# Patient Record
Sex: Female | Born: 1965 | Race: White | Hispanic: No | Marital: Married | Smoking: Never smoker
Health system: Southern US, Community
[De-identification: ages and names within clinical notes are randomized; demographics above are authoritative.]

## PROBLEM LIST (undated history)

## (undated) DIAGNOSIS — J45909 Unspecified asthma, uncomplicated: Secondary | ICD-10-CM

## (undated) DIAGNOSIS — F32A Depression, unspecified: Secondary | ICD-10-CM

## (undated) DIAGNOSIS — F329 Major depressive disorder, single episode, unspecified: Secondary | ICD-10-CM

## (undated) DIAGNOSIS — G43909 Migraine, unspecified, not intractable, without status migrainosus: Secondary | ICD-10-CM

## (undated) HISTORY — PX: CHOLECYSTECTOMY: SHX55

---

## 1997-12-23 ENCOUNTER — Other Ambulatory Visit: Admission: RE | Admit: 1997-12-23 | Discharge: 1997-12-23 | Payer: Self-pay | Admitting: Obstetrics and Gynecology

## 1999-04-25 ENCOUNTER — Other Ambulatory Visit: Admission: RE | Admit: 1999-04-25 | Discharge: 1999-04-25 | Payer: Self-pay | Admitting: Obstetrics and Gynecology

## 2000-05-14 ENCOUNTER — Other Ambulatory Visit: Admission: RE | Admit: 2000-05-14 | Discharge: 2000-05-14 | Payer: Self-pay | Admitting: Obstetrics and Gynecology

## 2001-08-13 ENCOUNTER — Other Ambulatory Visit: Admission: RE | Admit: 2001-08-13 | Discharge: 2001-08-13 | Payer: Self-pay | Admitting: Obstetrics and Gynecology

## 2003-01-05 ENCOUNTER — Other Ambulatory Visit: Admission: RE | Admit: 2003-01-05 | Discharge: 2003-01-05 | Payer: Self-pay | Admitting: Obstetrics and Gynecology

## 2004-03-27 ENCOUNTER — Other Ambulatory Visit: Admission: RE | Admit: 2004-03-27 | Discharge: 2004-03-27 | Payer: Self-pay | Admitting: Obstetrics and Gynecology

## 2005-06-04 ENCOUNTER — Other Ambulatory Visit: Admission: RE | Admit: 2005-06-04 | Discharge: 2005-06-04 | Payer: Self-pay | Admitting: Obstetrics and Gynecology

## 2008-03-17 ENCOUNTER — Ambulatory Visit: Payer: Self-pay | Admitting: Hematology

## 2015-11-20 ENCOUNTER — Emergency Department
Admission: EM | Admit: 2015-11-20 | Discharge: 2015-11-20 | Disposition: A | Discharge status: Home / Self Care | Payer: BC Managed Care – PPO | Attending: Emergency Medicine | Admitting: Emergency Medicine

## 2015-11-20 ENCOUNTER — Emergency Department: Payer: BC Managed Care – PPO

## 2015-11-20 ENCOUNTER — Encounter: Payer: Self-pay | Admitting: Emergency Medicine

## 2015-11-20 DIAGNOSIS — R079 Chest pain, unspecified: Secondary | ICD-10-CM | POA: Diagnosis not present

## 2015-11-20 DIAGNOSIS — J45909 Unspecified asthma, uncomplicated: Secondary | ICD-10-CM | POA: Diagnosis not present

## 2015-11-20 DIAGNOSIS — F329 Major depressive disorder, single episode, unspecified: Secondary | ICD-10-CM | POA: Insufficient documentation

## 2015-11-20 DIAGNOSIS — R55 Syncope and collapse: Secondary | ICD-10-CM | POA: Diagnosis present

## 2015-11-20 HISTORY — DX: Major depressive disorder, single episode, unspecified: F32.9

## 2015-11-20 HISTORY — DX: Depression, unspecified: F32.A

## 2015-11-20 HISTORY — DX: Migraine, unspecified, not intractable, without status migrainosus: G43.909

## 2015-11-20 HISTORY — DX: Unspecified asthma, uncomplicated: J45.909

## 2015-11-20 LAB — URINALYSIS COMPLETE WITH MICROSCOPIC (ARMC ONLY)
BILIRUBIN URINE: NEGATIVE
GLUCOSE, UA: NEGATIVE mg/dL
HGB URINE DIPSTICK: NEGATIVE
Ketones, ur: NEGATIVE mg/dL
Nitrite: NEGATIVE
Protein, ur: 30 mg/dL — AB
SPECIFIC GRAVITY, URINE: 1.016 (ref 1.005–1.030)
pH: 5 (ref 5.0–8.0)

## 2015-11-20 LAB — BASIC METABOLIC PANEL
ANION GAP: 9 (ref 5–15)
BUN: 13 mg/dL (ref 6–20)
CALCIUM: 8.7 mg/dL — AB (ref 8.9–10.3)
CO2: 26 mmol/L (ref 22–32)
CREATININE: 1.13 mg/dL — AB (ref 0.44–1.00)
Chloride: 101 mmol/L (ref 101–111)
GFR calc non Af Amer: 56 mL/min — ABNORMAL LOW (ref >60)
Glucose, Bld: 92 mg/dL (ref 65–99)
Potassium: 3.1 mmol/L — ABNORMAL LOW (ref 3.5–5.1)
SODIUM: 136 mmol/L (ref 135–145)

## 2015-11-20 LAB — CBC
HCT: 40.5 % (ref 35.0–47.0)
Hemoglobin: 13.5 g/dL (ref 12.0–16.0)
MCH: 32.2 pg (ref 26.0–34.0)
MCHC: 33.3 g/dL (ref 32.0–36.0)
MCV: 96.8 fL (ref 80.0–100.0)
PLATELETS: 217 10*3/uL (ref 150–440)
RBC: 4.18 MIL/uL (ref 3.80–5.20)
RDW: 14.1 % (ref 11.5–14.5)
WBC: 12.3 10*3/uL — AB (ref 3.6–11.0)

## 2015-11-20 LAB — GLUCOSE, CAPILLARY: GLUCOSE-CAPILLARY: 130 mg/dL — AB (ref 65–99)

## 2015-11-20 LAB — FIBRIN DERIVATIVES D-DIMER (ARMC ONLY): Fibrin derivatives D-dimer (ARMC): 583 — ABNORMAL HIGH (ref 0–499)

## 2015-11-20 LAB — TSH: TSH: 1.719 u[IU]/mL (ref 0.350–4.500)

## 2015-11-20 LAB — TROPONIN I

## 2015-11-20 LAB — POC URINE PREG, ED: PREG TEST UR: NEGATIVE

## 2015-11-20 IMAGING — CT CT ANGIO CHEST
2 of 6 series · 18 of 46 positions shown · IV contrast (APPLIED)
Comparison: Chest radiograph dated [DATE]

CLINICAL DATA: 49-year-old female with chest pain and syncope

EXAM:
CT ANGIOGRAPHY CHEST WITH CONTRAST
TECHNIQUE: Multidetector CT imaging of the chest was performed using the
standard protocol during bolus administration of intravenous
contrast. Multiplanar CT image reconstructions and MIPs were
obtained to evaluate the vascular anatomy.
CONTRAST:  75 cc Isovue 370

[Series 5: pe thins 1.5 · axial · 0.68mm/px · z∈[-406,-154]mm · 15 of 236 slices shown]
[im 13/236  lung]
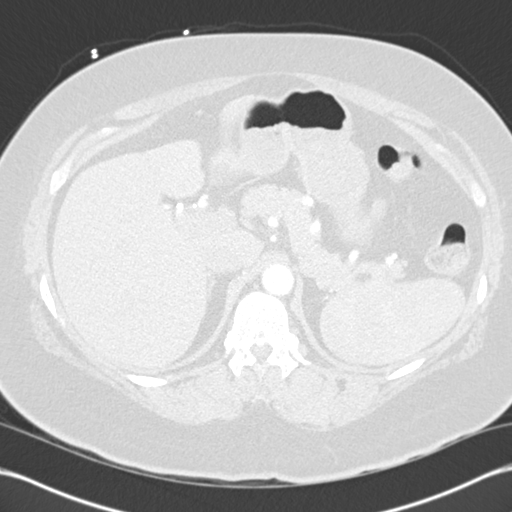
[im 25/236  soft-tissue]
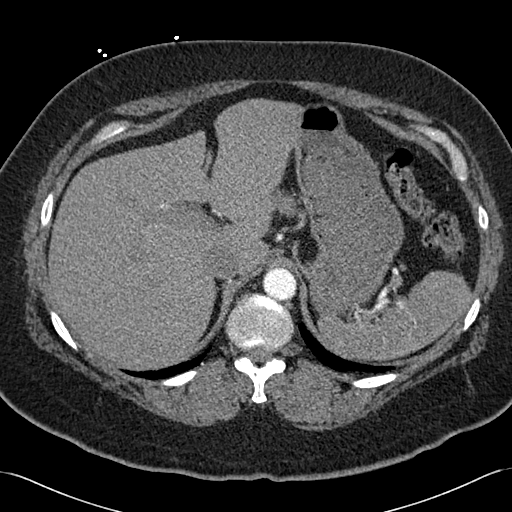
[im 50/236  lung]
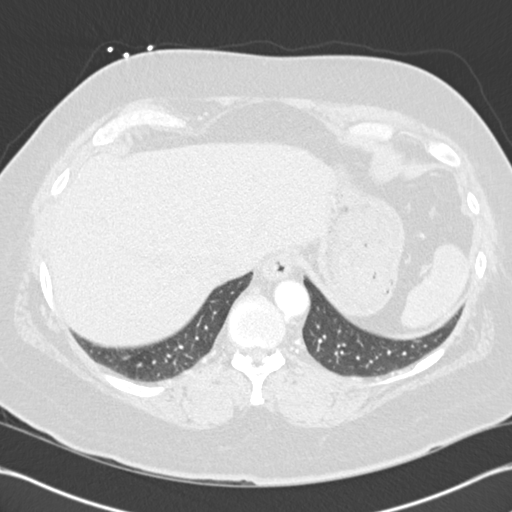
[im 62/236  soft-tissue]
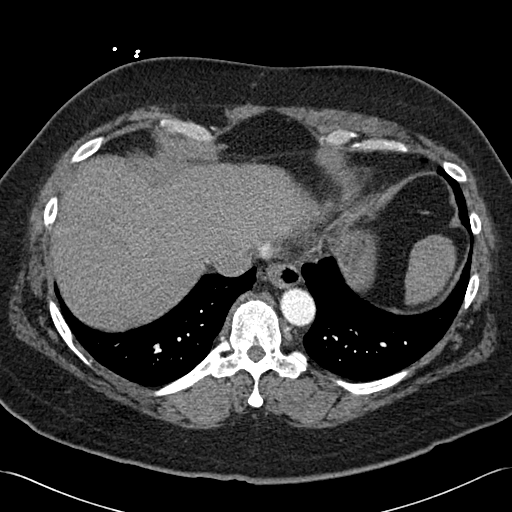
[im 75/236  lung]
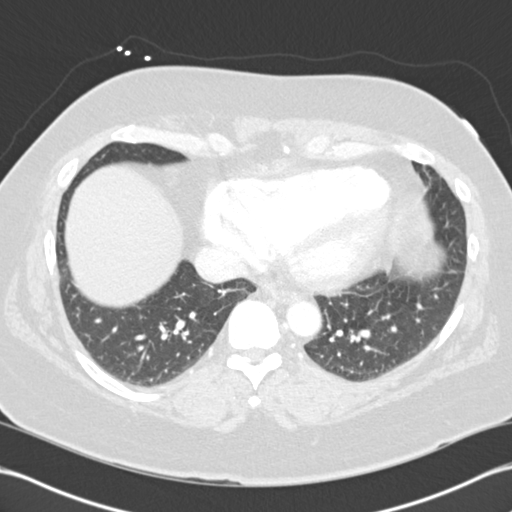
[im 87/236  soft-tissue]
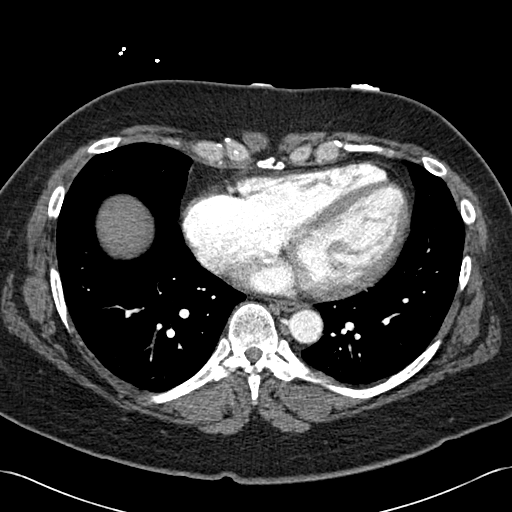
[im 99/236  lung]
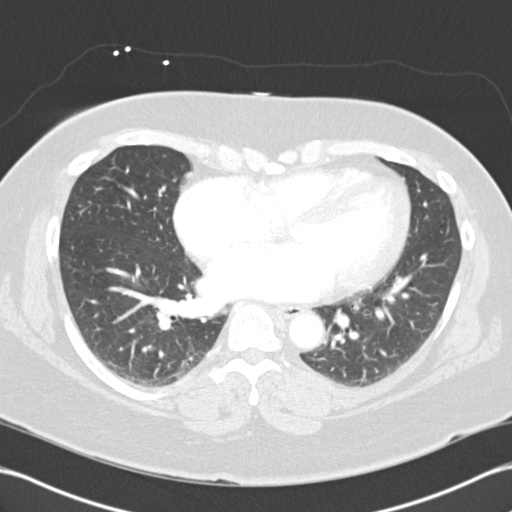
[im 124/236  soft-tissue]
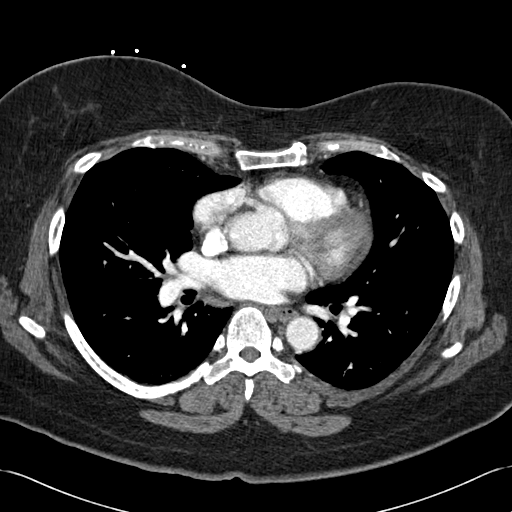
[im 137/236  lung]
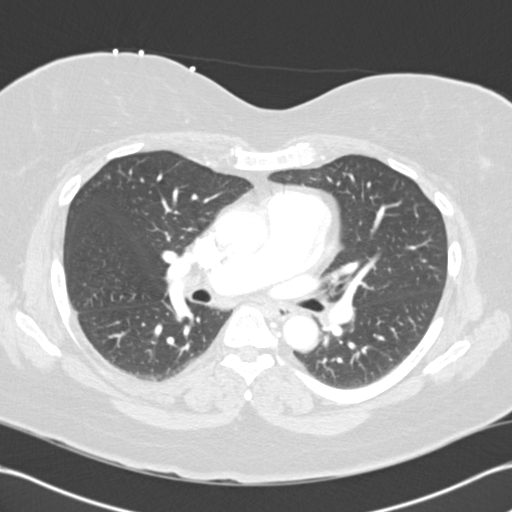
[im 149/236  soft-tissue]
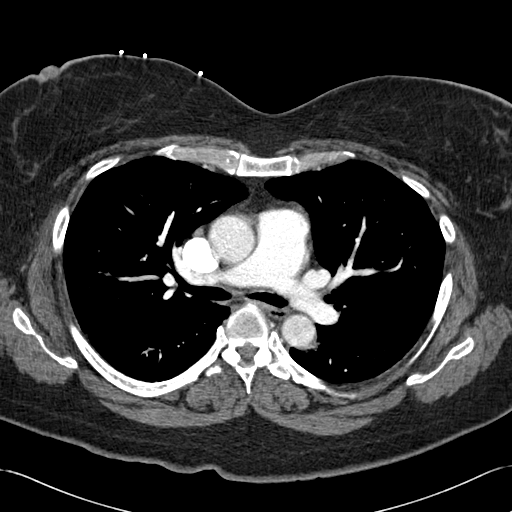
[im 161/236  lung]
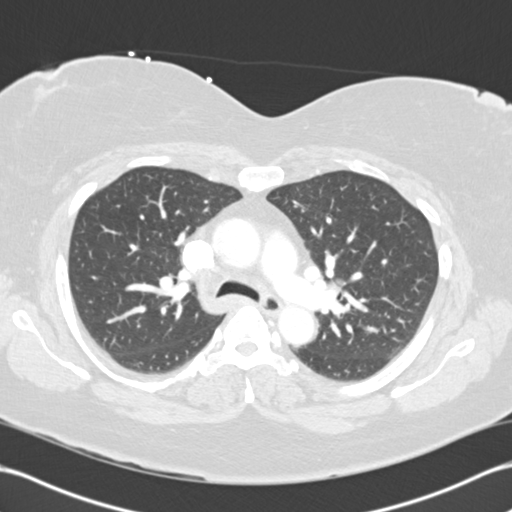
[im 174/236  soft-tissue]
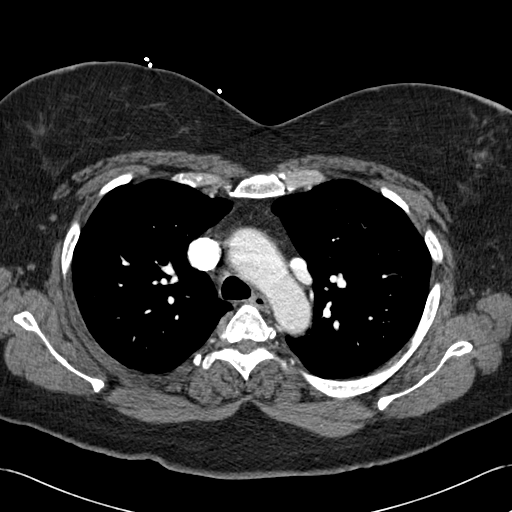
[im 198/236  lung]
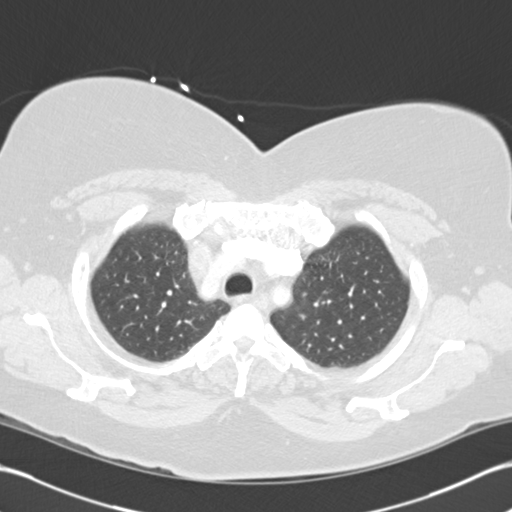
[im 211/236  soft-tissue]
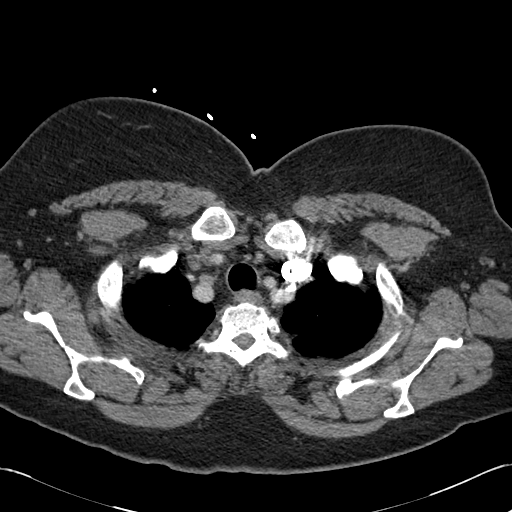
[im 223/236  lung]
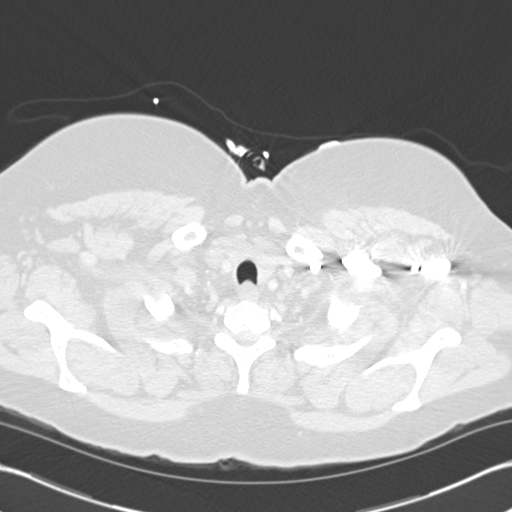

[Series 7: cor mpr 2.0 · coronal · 0.54mm/px · 3 of 126 slices shown]
[im 32/126  soft-tissue]
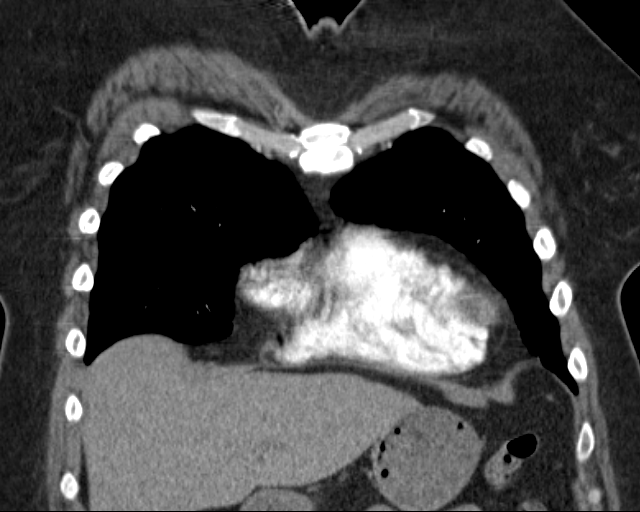
[im 63/126  soft-tissue]
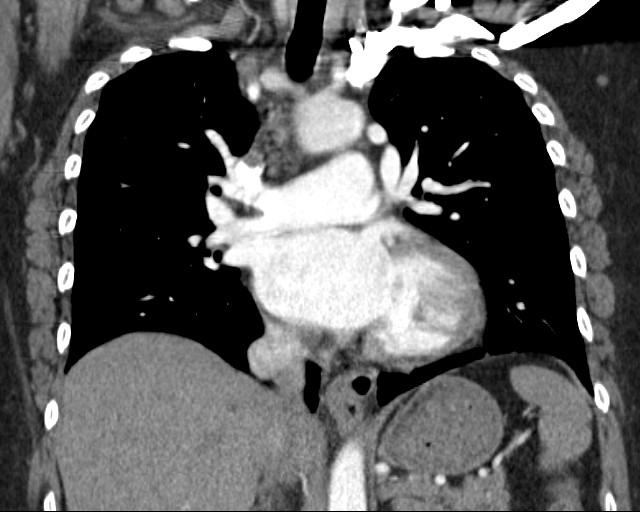
[im 94/126  soft-tissue]
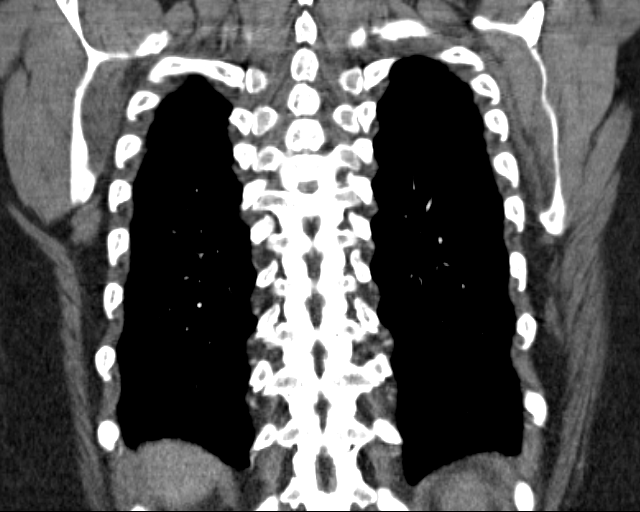

[18 of 46 positions shown; findings below may reference images not displayed]

FINDINGS: The lungs are clear. There is no pleural effusion or pneumothorax.
The central airways are patent.

The the thoracic aorta and the origins of the great vessels of the
aortic arch appear patent. There is slight prominence of the main
pulmonary trunk suggestive of underlying pulmonary hypertension. No
CT evidence of pulmonary embolism. Top-normal cardiac size. No
pericardial effusion. There is slight pectus excavatum deformity.

There is no hilar or mediastinal adenopathy. The esophagus and the
thyroid gland appear unremarkable.

There is no axillary adenopathy. The chest wall soft tissues appear
unremarkable. The osseous structures are intact.

The visualized upper abdomen appears unremarkable.

Review of the MIP images confirms the above findings.
IMPRESSION: No acute intrathoracic pathology. No CT evidence of pulmonary
embolism.

## 2015-11-20 IMAGING — CR DG CHEST 2V
2 series · 2 of 2 positions shown · non-contrast
Comparison: None.

CLINICAL DATA: Syncopal episode, chest pain

EXAM:
CHEST  2 VIEW

[chest pa]
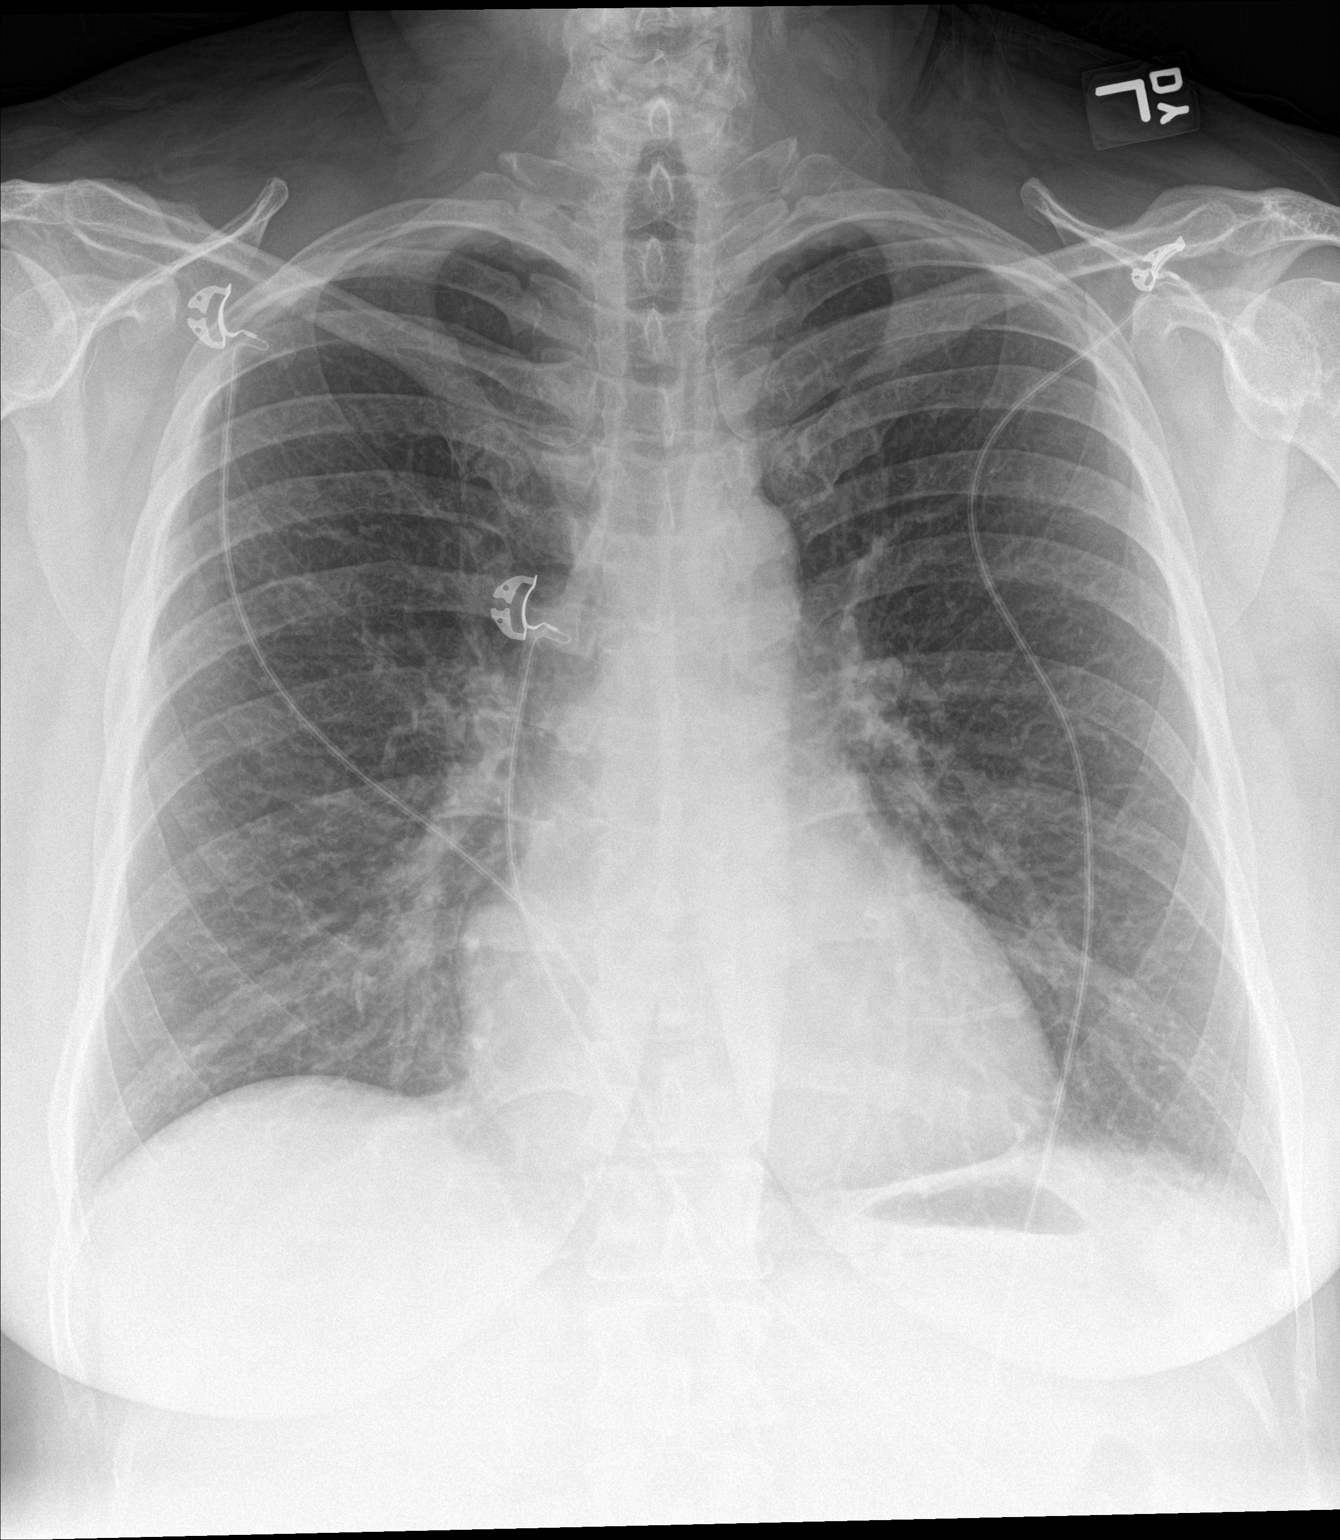

[chest lat]
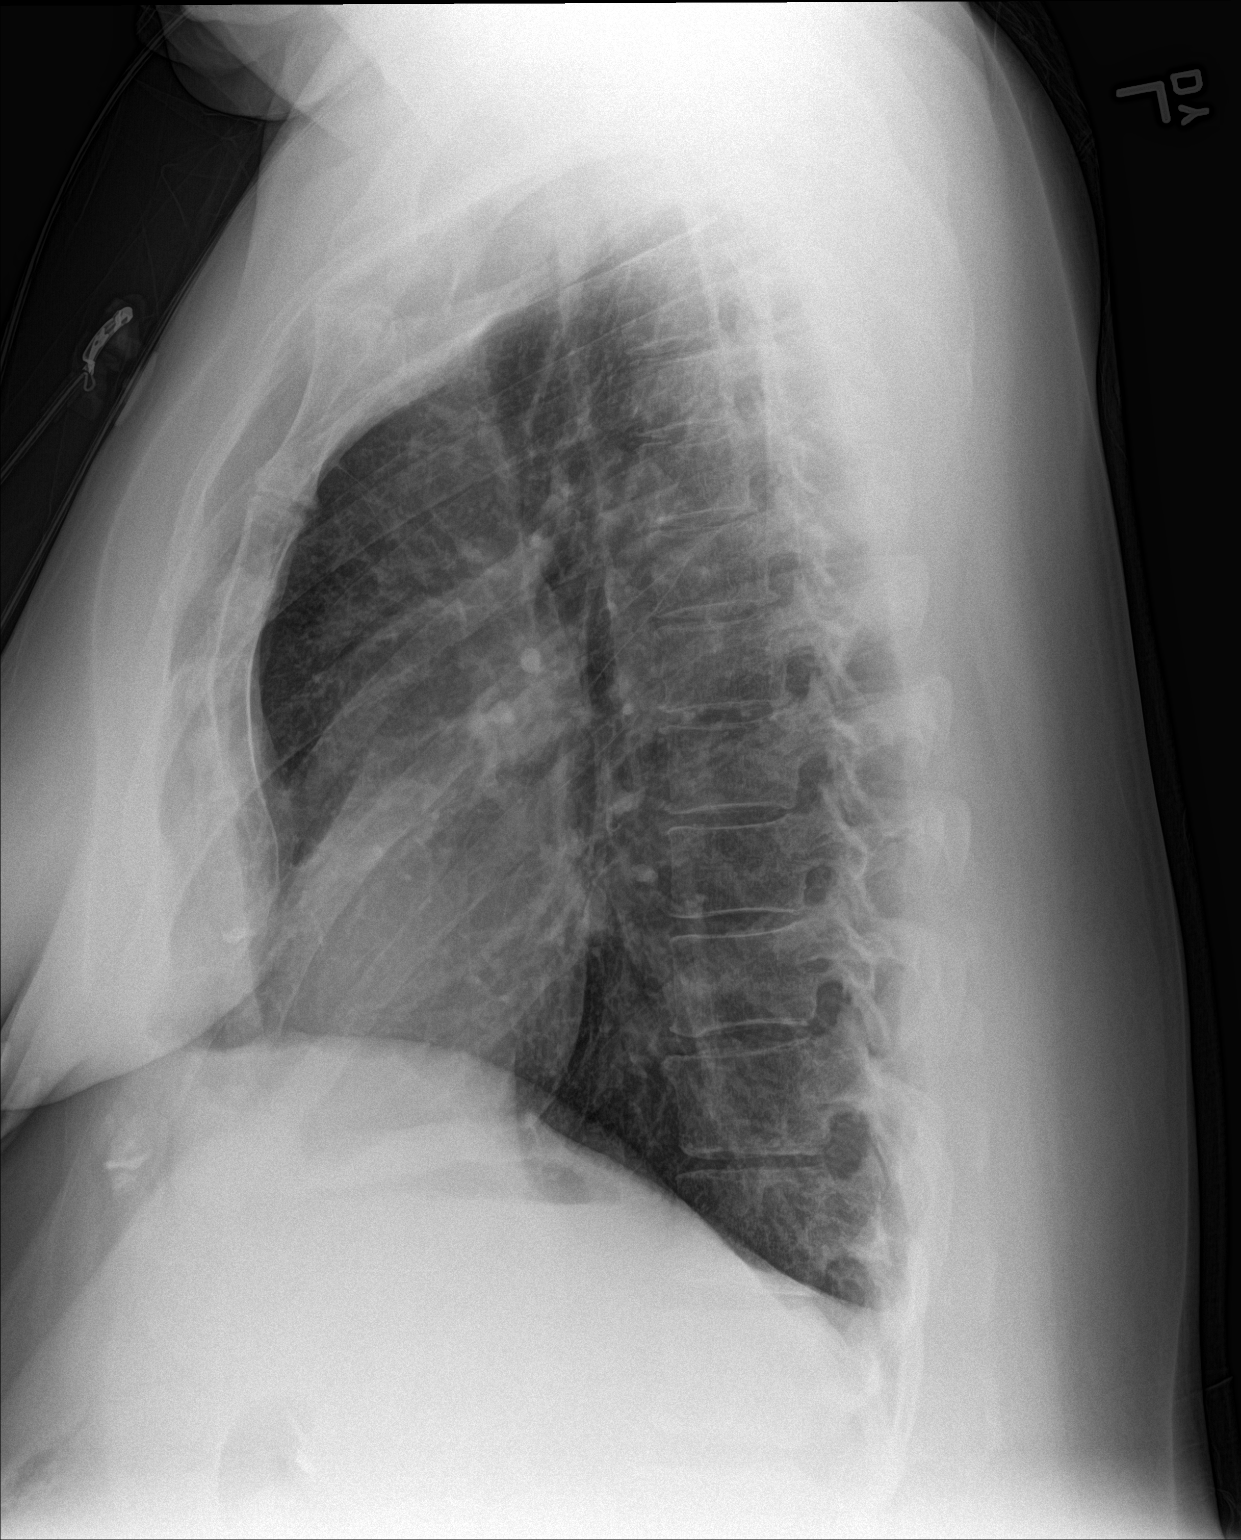

[2 of 2 positions shown; findings below may reference images not displayed]

FINDINGS: The heart size and mediastinal contours are within normal limits.
Both lungs are clear. The visualized skeletal structures are
unremarkable.
IMPRESSION: No active cardiopulmonary disease.

## 2015-11-20 MED ORDER — IOPAMIDOL (ISOVUE-370) INJECTION 76%
75.0000 mL | Freq: Once | INTRAVENOUS | Status: AC | PRN
  Administered 2015-11-20: 75 mL via INTRAVENOUS

## 2015-11-20 NOTE — ED Notes (Signed)
Patient was eating dinner when she began feeling like her "heart was beating funny" which gave her a "full in the head" feeling. She subsequently had 10-15 minutes of intermittent consciousness. Per family. Patient said she has had a 10 day migraine which has since resolved however patient has noted an increase in her sleep duration. Patient currently complains of nausea with dizziness and light headedness and diaphoresis.

## 2015-11-20 NOTE — ED Notes (Signed)
MD at bedside to update pt

## 2015-11-20 NOTE — ED Notes (Signed)
MD at bedside. 

## 2015-11-20 NOTE — ED Notes (Signed)
Pt up to the restroom at this time. Pt denies dizziness or lightheadedness. Pt able to ambulate independently.

## 2015-11-20 NOTE — ED Provider Notes (Signed)
[facility] Emergency Department Provider Note   ____________________________________________  Time seen: Approximately 3:30 PM  I have reviewed the triage vital signs and the nursing notes.   HISTORY  Chief Complaint Loss of Consciousness   HPI Jamie Greer is a 50 y.o. female with a history of migraine, peripheral edema as well as syncope in the past is presenting today after syncopal episode. She says that she had finished eating lunch for Mother's Day was watching her grandchild when she began to feel lightheaded. She said she was sitting at the time but just been holding the child and and feeling tired from providing childcare. She said that when she began to feel lightheaded and she also felt her heart pounding, heard a buzzing in her ear, became diaphoretic, short of breath and started to feel like she would pass out. She then went into a period of intermittent consciousness for about 10-15 minutes. After the transient state of consciousness she said that she had a brief episode lasting several minutes of left-sided chest pain radiating through to her back.She denies any this chest pain at this time. She says that she is now feeling nauseous. Orthostatic vital signs were taken prior to my arrival which said that she was beginning to feel short of breath again when standing up. She says that she did not drink any alcohol lunch. Said that she has had water and coffee this morning. Recently had a very long migraine lasting about 7-10 days. Her and her mother report that she has been very stressed lately at her job. The patient says that she is headache free at this time but at the time of the migraine the pain was of the posterior of her head and radiated forward around the entirety of her head. She said that she had a pounding quality to the headache and photophobia. She said that this is typical of her migraines in the past and that Maxalt helped alleviate the  pain. She said that since the migraine abated she has felt fatigued. There is a history of heart disease in the family but not until people are in their 3880s. Patient also says that she has a history of a murmur but that the murmur has gone away over time and the last time she was seen at her OB/GYN about one half years ago the murmur could not be heard.   Past Medical History  Diagnosis Date  . Depressed   . Asthma   . Migraine     There are no active problems to display for this patient.   Past Surgical History  Procedure Laterality Date  . Cholecystectomy      No current outpatient prescriptions on file.  Allergies Review of patient's allergies indicates no known allergies.  History reviewed. No pertinent family history.  Social History Social History  Substance Use Topics  . Smoking status: Never Smoker   . Smokeless tobacco: None  . Alcohol Use: None    Review of Systems Constitutional: No fever/chills Eyes: No visual changes. ENT: No sore throat. Cardiovascular: As above Respiratory: As above Gastrointestinal: No abdominal pain.  No nausea, no vomiting.  No diarrhea.  No constipation. Genitourinary: Negative for dysuria. Musculoskeletal: As above Skin: Negative for rash. Neurological: Negative for headaches, focal weakness or numbness.  10-point ROS otherwise negative.  ____________________________________________   PHYSICAL EXAM:  VITAL SIGNS: ED Triage Vitals  Enc Vitals Group     BP 11/20/15 1528 114/68 mmHg  Pulse Rate 11/20/15 1527 60     Resp 11/20/15 1528 17     Temp 11/20/15 1527 97.8 F (36.6 C)     Temp Source 11/20/15 1527 Oral     SpO2 11/20/15 1527 98 %     Weight 11/20/15 1527 262 lb (118.842 kg)     Height 11/20/15 1527  (1.727 m)     Head Cir --      Peak Flow --      Pain Score --      Pain Loc --      Pain Edu? --      Excl. in GC? --     Constitutional: Alert and oriented. Well appearing and in no acute  distress. Eyes: Conjunctivae are normal. PERRL. EOMI. Head: Atraumatic. Nose: No congestion/rhinnorhea. Mouth/Throat: Mucous membranes are moist.   Neck: No stridor.   Cardiovascular: Normal rate, regular rhythm. Grossly normal heart sounds.   Respiratory: Normal respiratory effort.  No retractions. Lungs CTAB. Gastrointestinal: Soft and nontender. No distention. No abdominal bruits. No CVA tenderness. Musculoskeletal: Moderate lower extremity edema bilaterally which the patient says is her baseline.  No joint effusions. Neurologic:  Normal speech and language. No gross focal neurologic deficits are appreciated.  Skin:  Skin is warm, dry and intact. No rash noted. Psychiatric: Mood and affect are normal. Speech and behavior are normal.  ____________________________________________   LABS (all labs ordered are listed, but only abnormal results are displayed)  Labs Reviewed  CBC - Abnormal; Notable for the following:    WBC 12.3 (*)    All other components within normal limits  URINALYSIS COMPLETEWITH MICROSCOPIC ([facility] ONLY) - Abnormal; Notable for the following:    Color, Urine AMBER (*)    APPearance CLOUDY (*)    Protein, ur 30 (*)    Leukocytes, UA 1+ (*)    Bacteria, UA RARE (*)    Squamous Epithelial / LPF 6-30 (*)    All other components within normal limits  GLUCOSE, CAPILLARY - Abnormal; Notable for the following:    Glucose-Capillary 130 (*)    All other components within normal limits  FIBRIN DERIVATIVES D-DIMER ([facility] ONLY) - Abnormal; Notable for the following:    Fibrin derivatives D-dimer ([facility]) 583 (*)    All other components within normal limits  BASIC METABOLIC PANEL - Abnormal; Notable for the following:    Potassium 3.1 (*)    Creatinine, Ser 1.13 (*)    Calcium 8.7 (*)    GFR calc non Af Amer 56 (*)    All other components within normal limits  TROPONIN I  TSH  TROPONIN I  CBG MONITORING, ED  POC URINE PREG, ED    ____________________________________________  EKG  ED ECG REPORT I, Arelia Longest, the attending physician, personally viewed and interpreted this ECG.   Date: 11/20/2015  EKG Time: 1527  Rate: 59  Rhythm: normal sinus rhythm  Axis: Normal axis  Intervals:left anterior fascicular block and Incomplete right bundle branch block  ST&T Change: No ST segment elevation or depression. No abnormal T-wave inversion.  ____________________________________________  RADIOLOGY CT Angio Chest PE W/Cm &/Or Wo Cm (Final result) Result time: 11/20/15 18:42:34   Final result by Rad Results In Interface (11/20/15 18:42:34)   Narrative:   CLINICAL DATA: 50 year old female with chest pain and syncope  EXAM: CT ANGIOGRAPHY CHEST WITH CONTRAST  TECHNIQUE: Multidetector CT imaging of the chest was performed using the standard protocol during bolus administration of intravenous contrast. Multiplanar  CT image reconstructions and MIPs were obtained to evaluate the vascular anatomy.  CONTRAST: 75 cc Isovue 370  COMPARISON: Chest radiograph dated 11/20/2015  FINDINGS: The lungs are clear. There is no pleural effusion or pneumothorax. The central airways are patent.  The the thoracic aorta and the origins of the great vessels of the aortic arch appear patent. There is slight prominence of the main pulmonary trunk suggestive of underlying pulmonary hypertension. No CT evidence of pulmonary embolism. Top-normal cardiac size. No pericardial effusion. There is slight pectus excavatum deformity.  There is no hilar or mediastinal adenopathy. The esophagus and the thyroid gland appear unremarkable.  There is no axillary adenopathy. The chest wall soft tissues appear unremarkable. The osseous structures are intact.  The visualized upper abdomen appears unremarkable.  Review of the MIP images confirms the above findings.  IMPRESSION: No acute intrathoracic pathology. No CT evidence  of pulmonary embolism.   Electronically Signed By: Elgie Collard M.D. On: 11/20/2015 18:42          DG Chest 2 View (Final result) Result time: 11/20/15 16:26:42   Final result by Rad Results In Interface (11/20/15 16:26:42)   Narrative:   CLINICAL DATA: Syncopal episode, chest pain  EXAM: CHEST 2 VIEW  COMPARISON: None.  FINDINGS: The heart size and mediastinal contours are within normal limits. Both lungs are clear. The visualized skeletal structures are unremarkable.  IMPRESSION: No active cardiopulmonary disease.   Electronically Signed By: Elige Ko On: 11/20/2015 16:26     ____________________________________________   PROCEDURES   ____________________________________________   INITIAL IMPRESSION / ASSESSMENT AND PLAN / ED COURSE  Pertinent labs & imaging results that were available during my care of the patient were reviewed by me and considered in my medical decision making (see chart for details).  ----------------------------------------- 8:49 PM on 11/20/2015 -----------------------------------------  Patient resting comfortable at this time. Very reassuring lab workup. Story is consistent with a vasovagal syncope. Unclear cause of the chest pain but very reassuring cardiac workup as well as negative PE study. A splint is to the patient. Explained precautions to make sure to sit if symptomatic and to make sure drink plenty of water. She understands and is willing to comply. Will be following up with her primary care doctor. ____________________________________________   FINAL CLINICAL IMPRESSION(S) / ED DIAGNOSES  Syncope. Chest pain.    NEW MEDICATIONS STARTED DURING THIS VISIT:  New Prescriptions   No medications on file     Note:  This document was prepared using Dragon voice recognition software and may include unintentional dictation errors.    Myrna Blazer, MD 11/20/15 [id number]

## 2016-10-29 ENCOUNTER — Other Ambulatory Visit: Payer: Self-pay | Admitting: Obstetrics and Gynecology

## 2016-10-29 DIAGNOSIS — R928 Other abnormal and inconclusive findings on diagnostic imaging of breast: Secondary | ICD-10-CM

## 2016-10-31 ENCOUNTER — Ambulatory Visit
Admission: RE | Admit: 2016-10-31 | Discharge: 2016-10-31 | Disposition: A | Discharge status: Home / Self Care | Payer: BC Managed Care – PPO | Source: Ambulatory Visit | Attending: Obstetrics and Gynecology | Admitting: Obstetrics and Gynecology

## 2016-10-31 DIAGNOSIS — R928 Other abnormal and inconclusive findings on diagnostic imaging of breast: Secondary | ICD-10-CM

## 2016-10-31 IMAGING — MG 2D DIGITAL DIAGNOSTIC UNILATERAL LEFT MAMMOGRAM WITH CAD AND ADJ
9 series · 9 of 21 positions shown · non-contrast
Comparison: Previous exam(s).

CLINICAL DATA: 50-year-old female for further evaluation of
possible left breast mass on screening mammogram.

EXAM:
2D DIGITAL DIAGNOSTIC LEFT MAMMOGRAM WITH ADJUNCT TOMO
ULTRASOUND LEFT BREAST

[L CC synth-2D (1 of 2)]
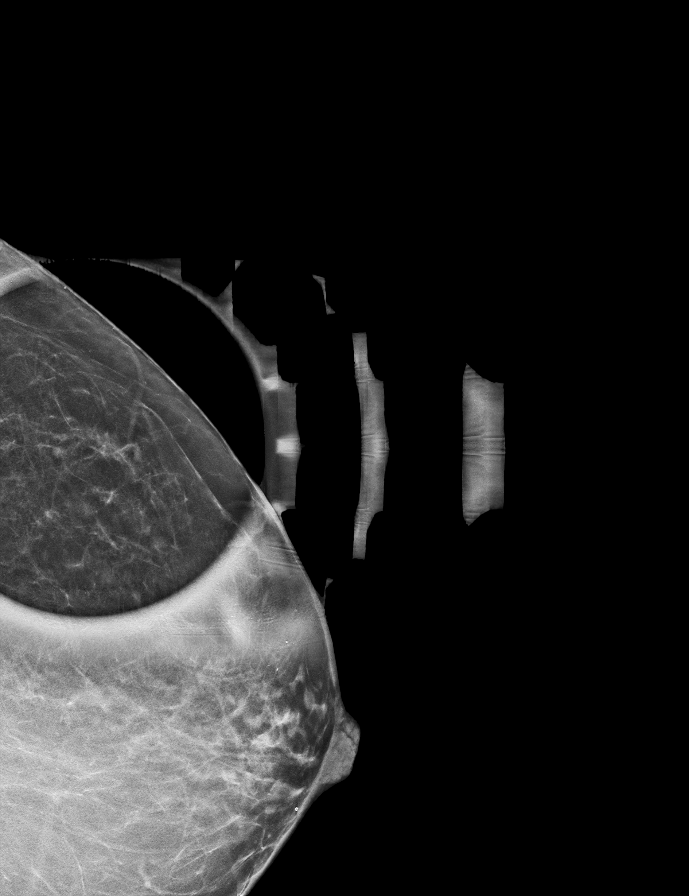

[L CC (1 of 2)]
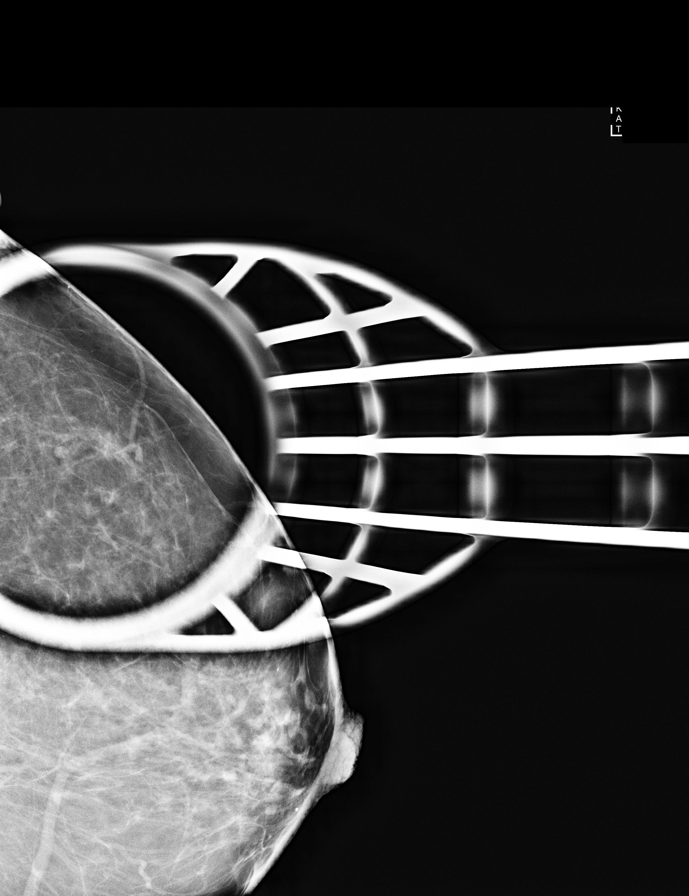

[L MLO synth-2D]
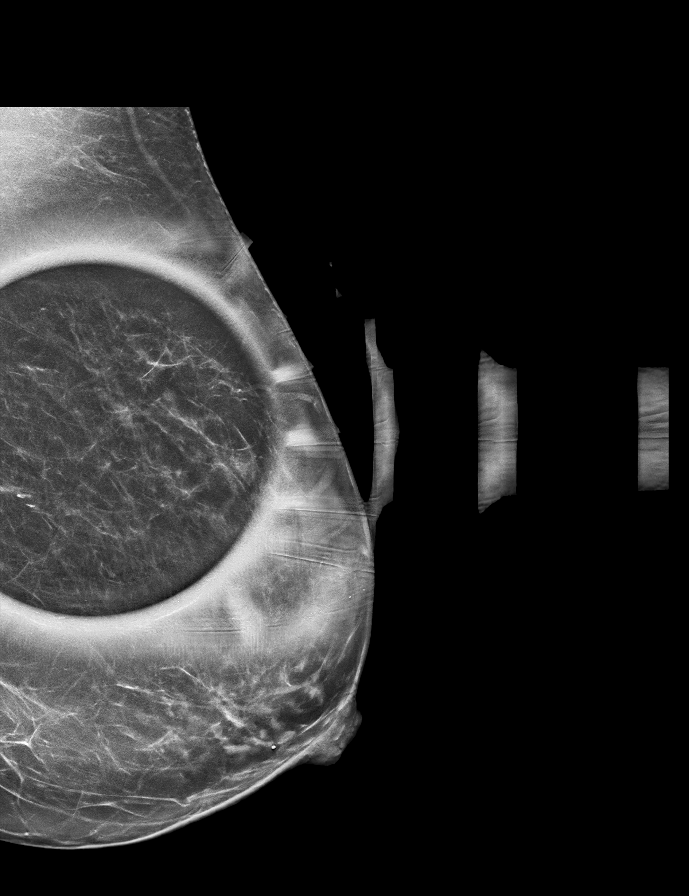

[L MLO]
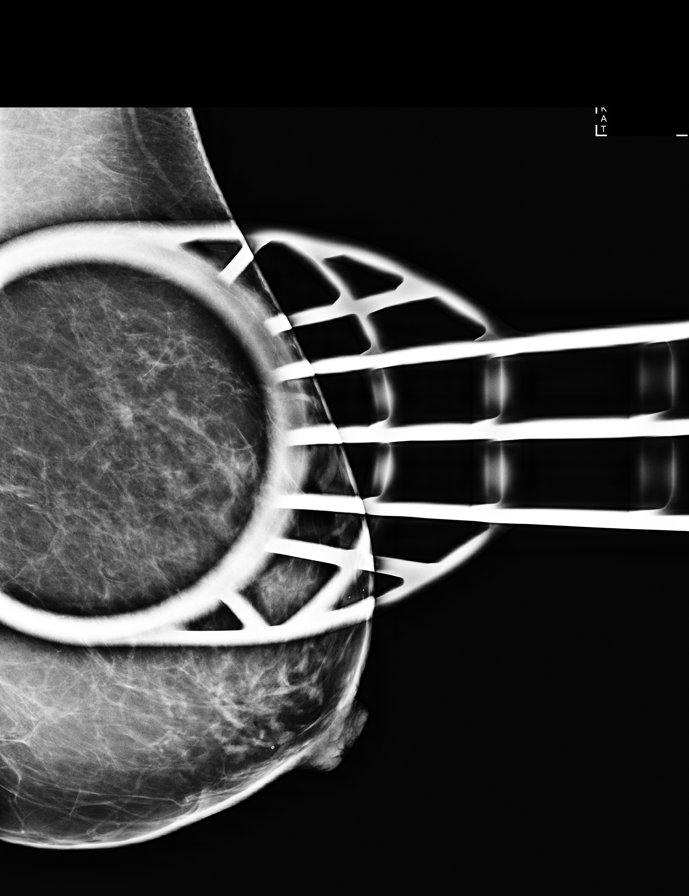

[L CC synth-2D (2 of 2)]
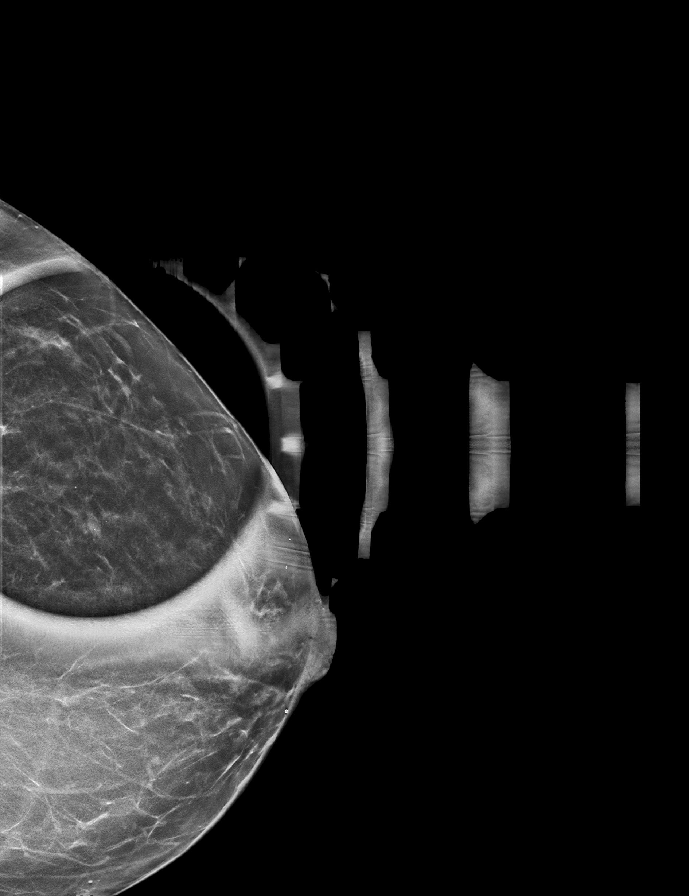

[L CC (2 of 2)]
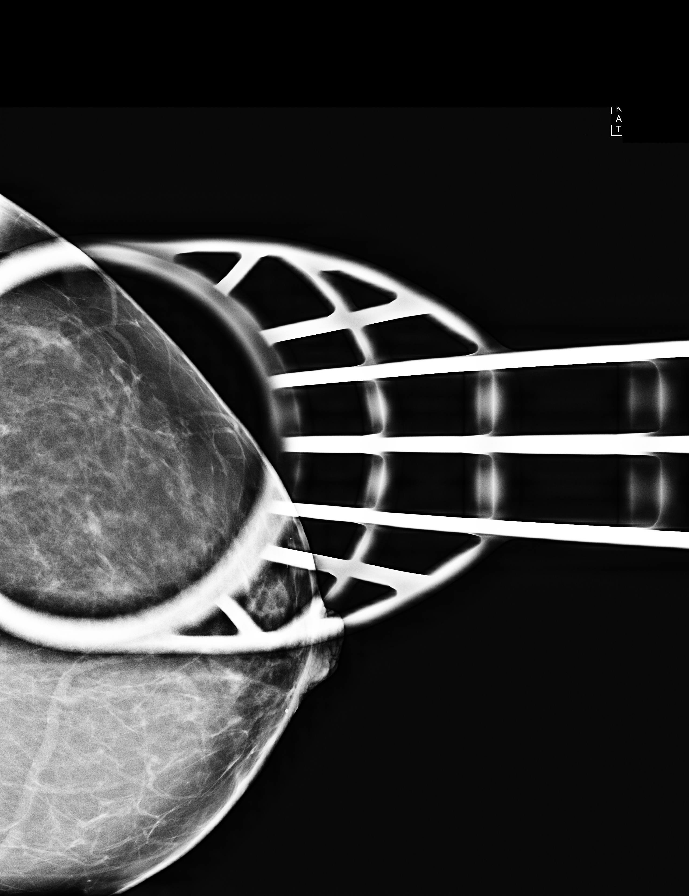

[L CC tomo (1 of 2) · tomo slice 23/45.0]
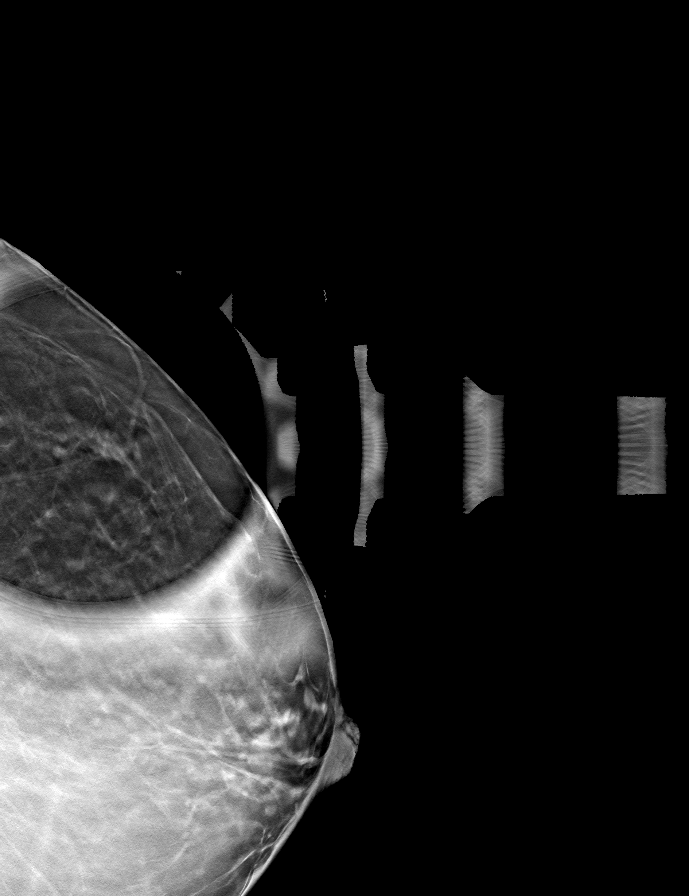

[L MLO tomo · tomo slice 31/61.0]
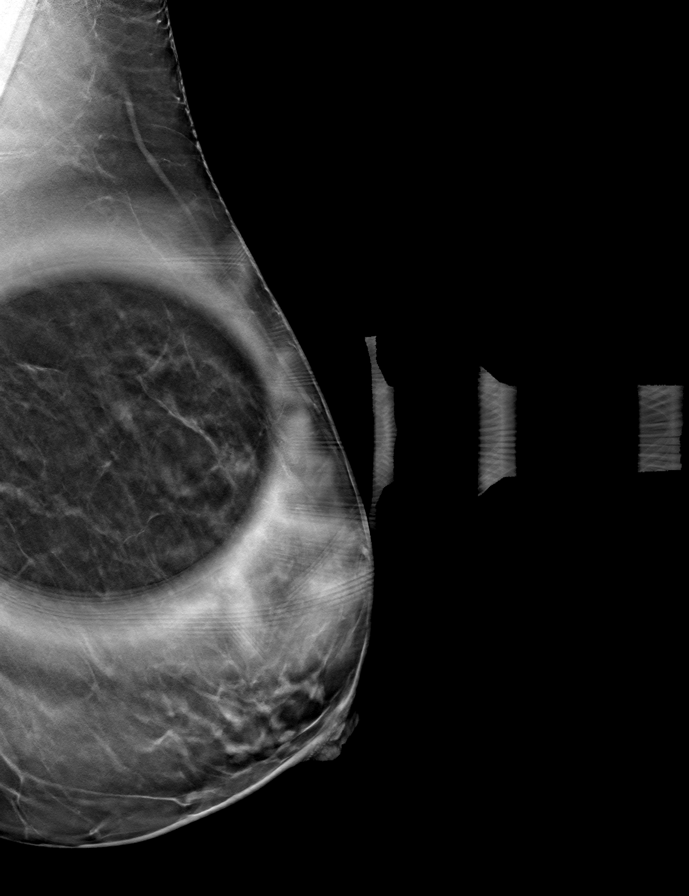

[L CC tomo (2 of 2) · tomo slice 26/51.0]
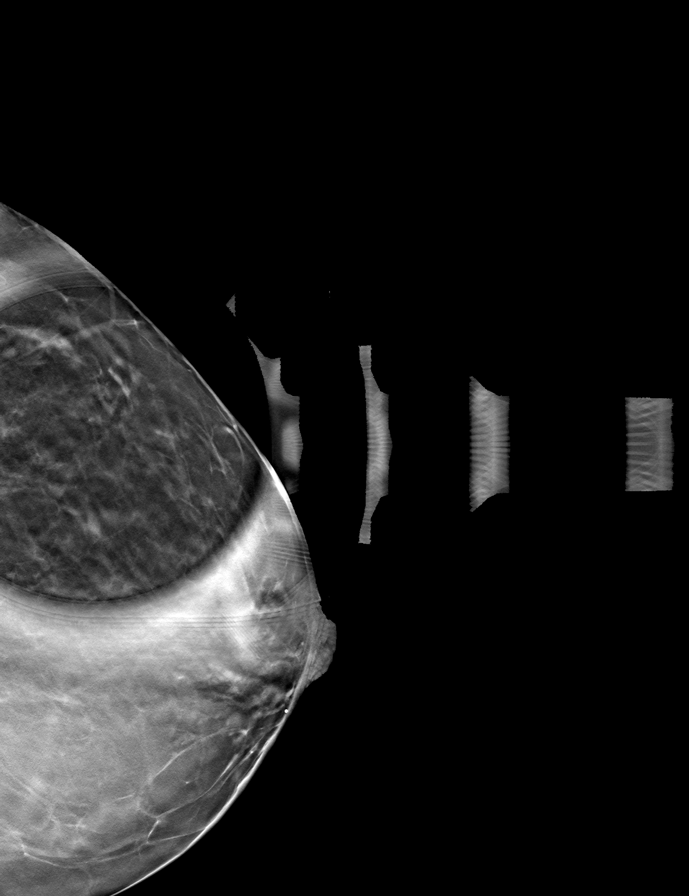

[9 of 21 positions shown; findings below may reference images not displayed]

ACR Breast Density Category b: There are scattered areas of
fibroglandular density.
FINDINGS: 2D and 3D spot compression views of the left breast demonstrate a 4
mm circumscribed oval mass within the upper-outer left breast.

Targeted ultrasound is performed, showing a 2 x 3 mm benign cyst at
the 2 o'clock position of the left breast 6 cm from the nipple,
corresponding to the screening study finding.
IMPRESSION: Benign cyst within the upper outer left breast corresponding to the
screening study finding.

RECOMMENDATION:
Bilateral screening mammograms in 1 year.

I have discussed the findings and recommendations with the patient.
Results were also provided in writing at the conclusion of the
visit. If applicable, a reminder letter will be sent to the patient
regarding the next appointment.

BI-RADS CATEGORY  2: Benign.

## 2016-10-31 IMAGING — US ULTRASOUND LEFT BREAST LIMITED
1 series · 5 of 5 positions shown · non-contrast
Comparison: Previous exam(s).

CLINICAL DATA: 50-year-old female for further evaluation of
possible left breast mass on screening mammogram.

EXAM:
2D DIGITAL DIAGNOSTIC LEFT MAMMOGRAM WITH ADJUNCT TOMO
ULTRASOUND LEFT BREAST

[Series 1: ultrasound left breast limited · 0.06mm/px · 5 of 5 slices shown]
[im 1/5]
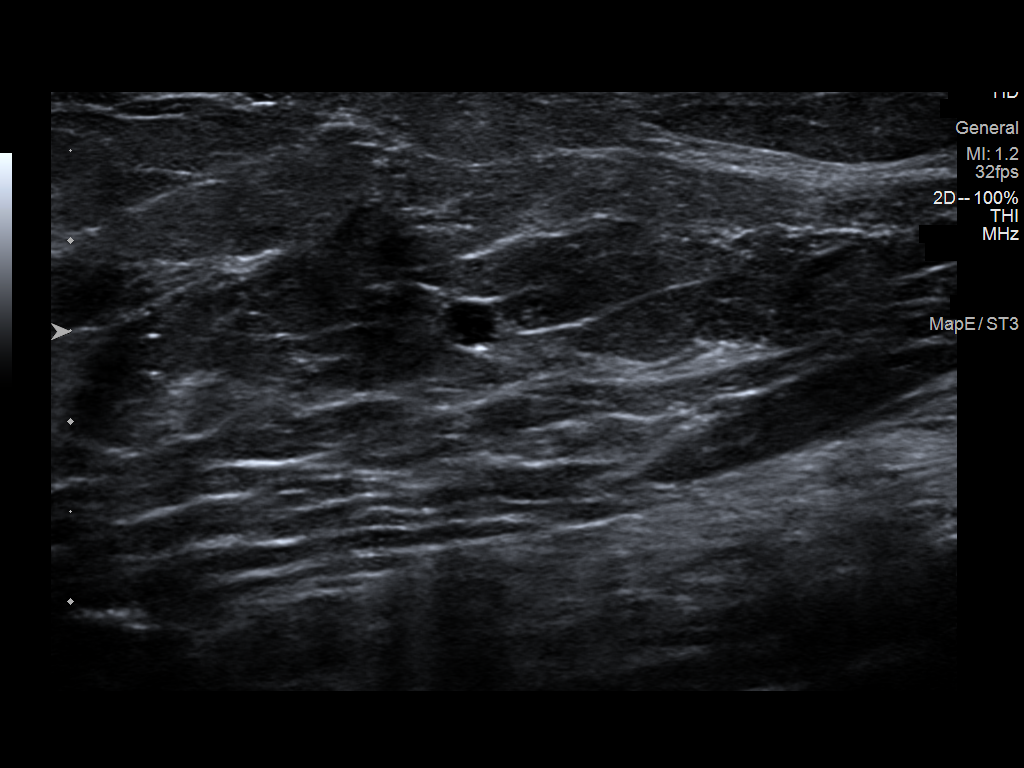
[im 2/5]
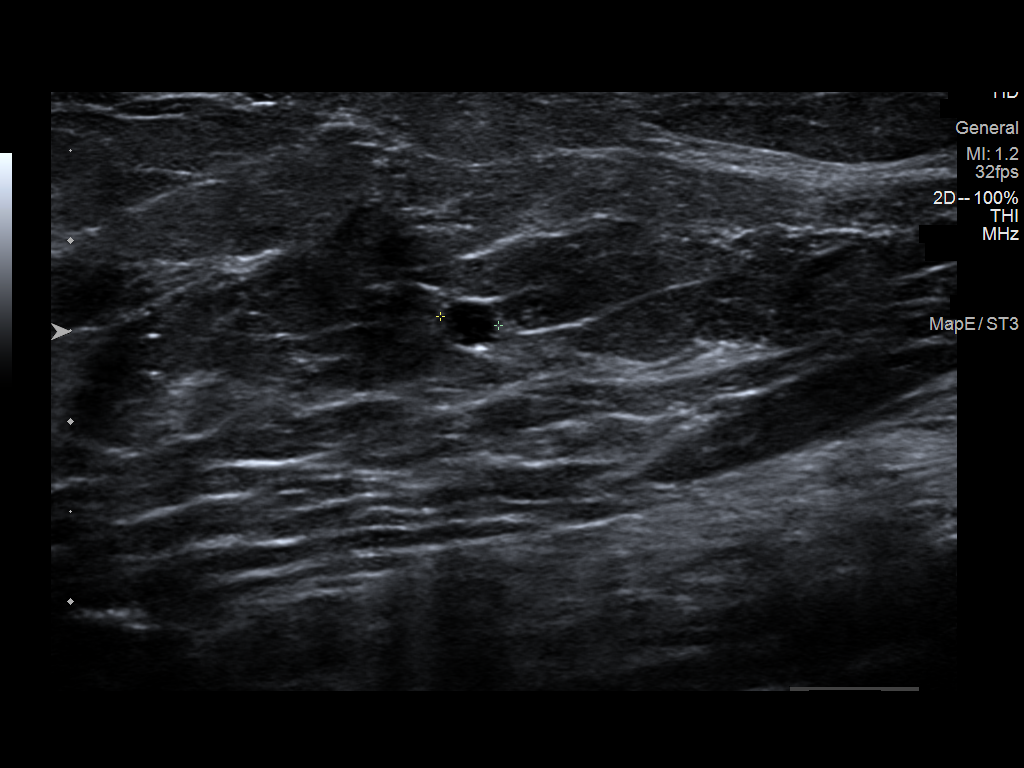
[im 3/5]
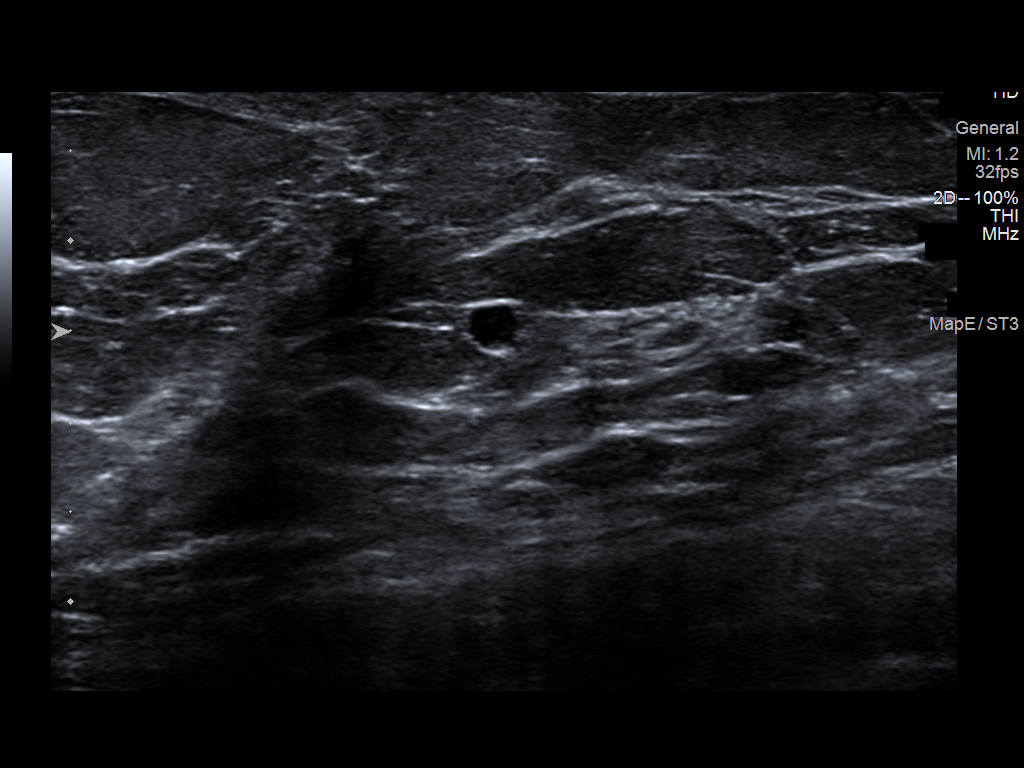
[im 4/5]
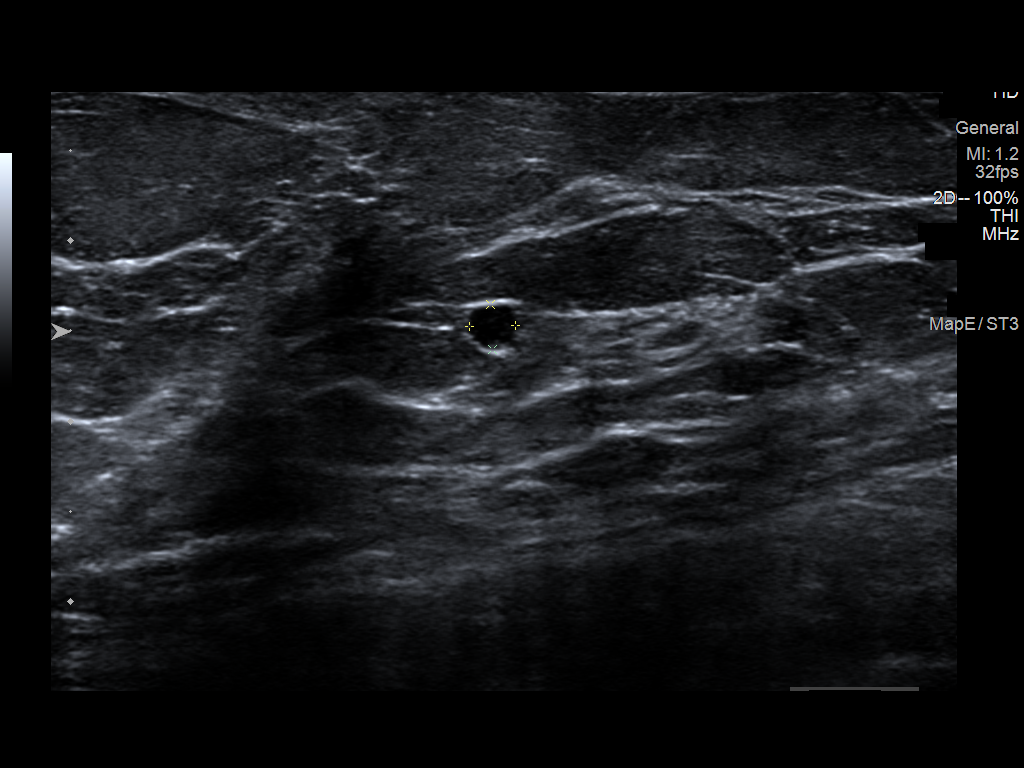
[im 5/5]
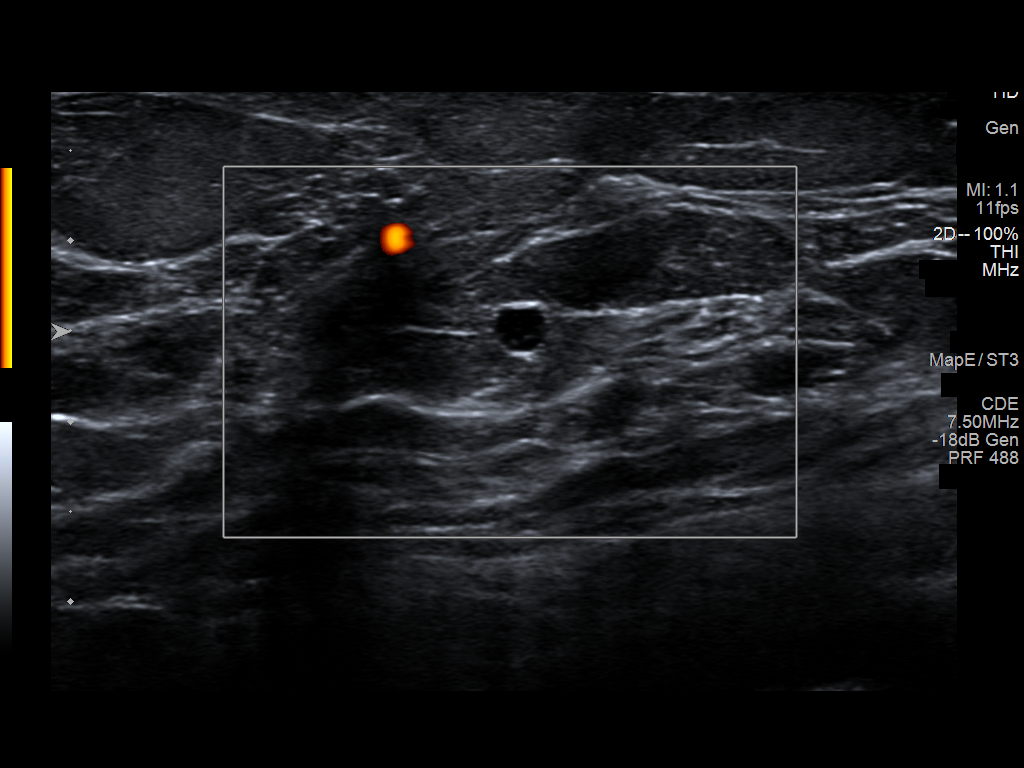

[5 of 5 positions shown; findings below may reference images not displayed]

ACR Breast Density Category b: There are scattered areas of
fibroglandular density.
FINDINGS: 2D and 3D spot compression views of the left breast demonstrate a 4
mm circumscribed oval mass within the upper-outer left breast.

Targeted ultrasound is performed, showing a 2 x 3 mm benign cyst at
the 2 o'clock position of the left breast 6 cm from the nipple,
corresponding to the screening study finding.
IMPRESSION: Benign cyst within the upper outer left breast corresponding to the
screening study finding.

RECOMMENDATION:
Bilateral screening mammograms in 1 year.

I have discussed the findings and recommendations with the patient.
Results were also provided in writing at the conclusion of the
visit. If applicable, a reminder letter will be sent to the patient
regarding the next appointment.

BI-RADS CATEGORY  2: Benign.

## 2017-04-18 ENCOUNTER — Encounter: Payer: Self-pay | Admitting: Genetics

## 2017-04-23 ENCOUNTER — Encounter: Payer: Self-pay | Admitting: Genetics

## 2020-02-19 ENCOUNTER — Encounter: Payer: Self-pay | Admitting: Genetic Counselor

## 2021-09-20 ENCOUNTER — Other Ambulatory Visit: Payer: Self-pay | Admitting: Obstetrics and Gynecology

## 2021-09-20 DIAGNOSIS — Z9189 Other specified personal risk factors, not elsewhere classified: Secondary | ICD-10-CM

## 2021-09-20 DIAGNOSIS — M24811 Other specific joint derangements of right shoulder, not elsewhere classified: Secondary | ICD-10-CM

## 2021-10-18 ENCOUNTER — Ambulatory Visit
Admission: RE | Admit: 2021-10-18 | Discharge: 2021-10-18 | Disposition: A | Discharge status: Home / Self Care | Payer: BC Managed Care – PPO | Source: Ambulatory Visit | Attending: Obstetrics and Gynecology | Admitting: Obstetrics and Gynecology

## 2021-10-18 DIAGNOSIS — M24811 Other specific joint derangements of right shoulder, not elsewhere classified: Secondary | ICD-10-CM

## 2021-10-18 DIAGNOSIS — Z9189 Other specified personal risk factors, not elsewhere classified: Secondary | ICD-10-CM

## 2021-10-18 IMAGING — MR MR BREAST BILAT WO/W CM
8 of 12 series · 33 of 48 positions shown · IV contrast (gadavist)
Comparison: None.

CLINICAL DATA: High risk screening exam. Family history of breast
cancer in the patient's mother at age 40.

EXAM:
BILATERAL BREAST MRI WITH AND WITHOUT CONTRAST
TECHNIQUE: Multiplanar, multisequence MR images of both breasts were obtained
prior to and following the intravenous administration of 10 ml of
Gadavist

[Series 2: t2_tirm_tra ipat (a-p) · axial · 3.0mm · 0.70mm/px · 1 of 55 slices shown]
[im 1/55]
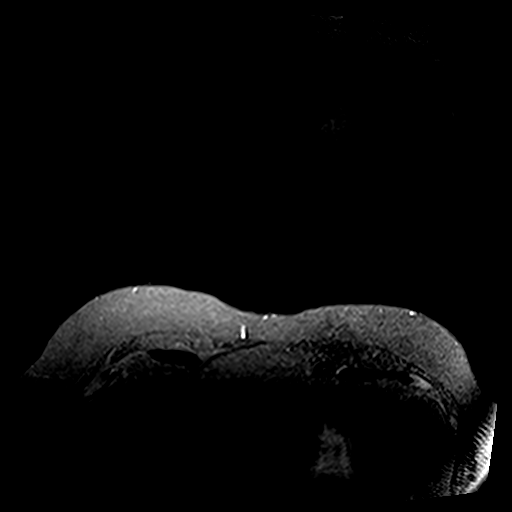

[Series 3: fl3d pre-cm no · axial · non-contrast · 1.2mm · 0.91mm/px · z∈[-102,+70]mm · 5 of 144 slices shown]
[im 1/144]
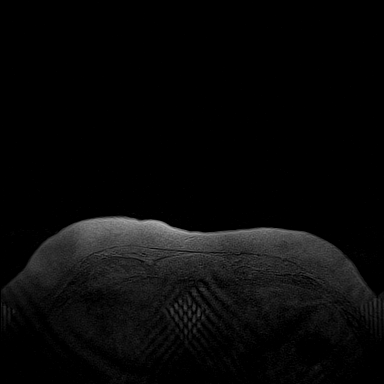
[im 36/144]
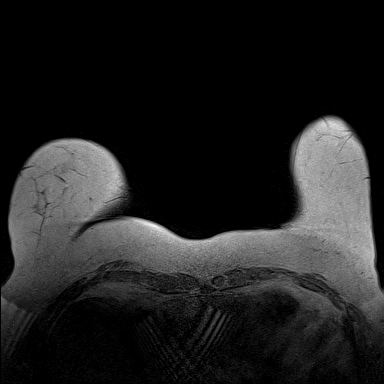
[im 72/144]
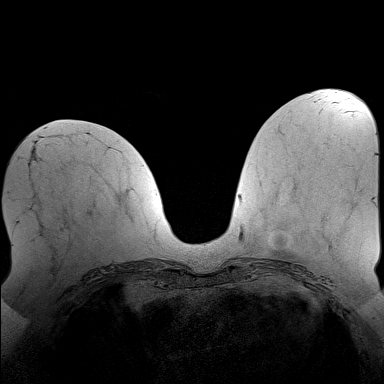
[im 108/144]
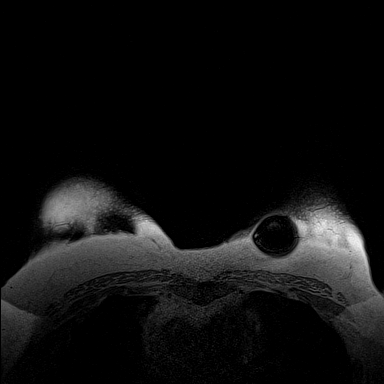
[im 144/144]
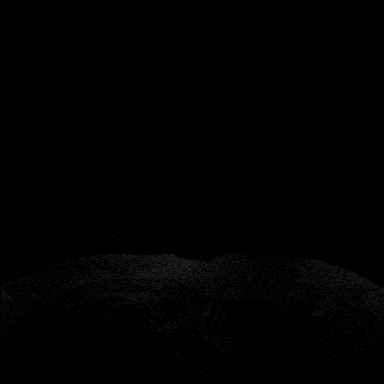

[Series 4: fl3d pre-cm · axial · non-contrast · 1.2mm · 0.91mm/px · z∈[-102,+70]mm · 5 of 144 slices shown]
[im 1/144]
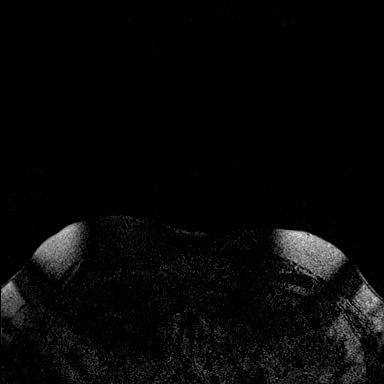
[im 36/144]
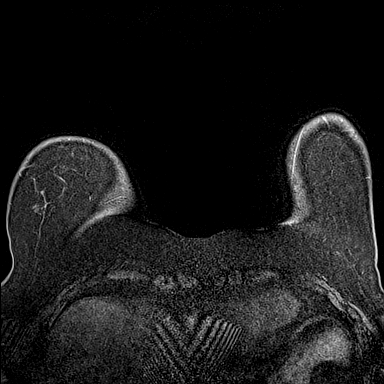
[im 72/144]
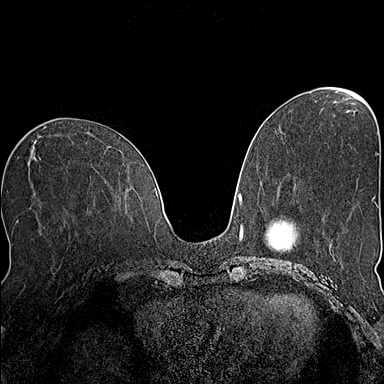
[im 108/144]
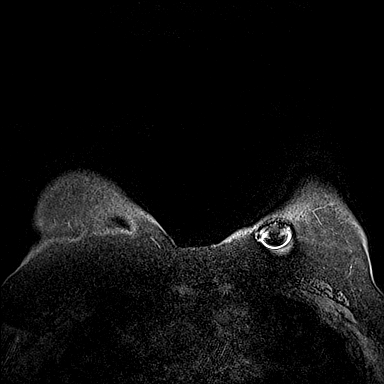
[im 144/144]
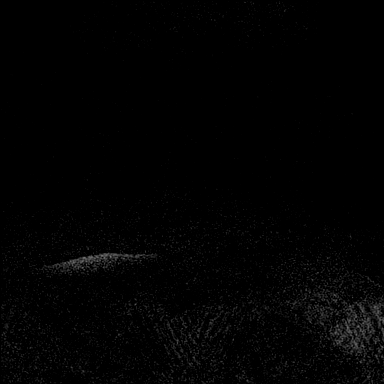

[Series 5: fl3d post-cm 20 · axial · 1.2mm · 0.91mm/px · z∈[-102,+70]mm · 5 of 144 slices shown (1 of 3)]
[im 1/144]
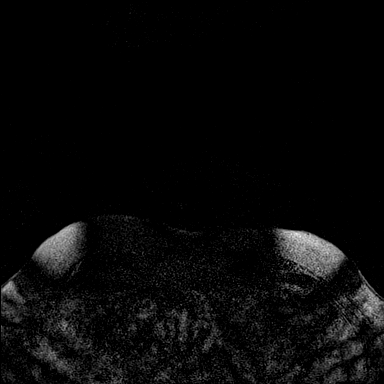
[im 36/144]
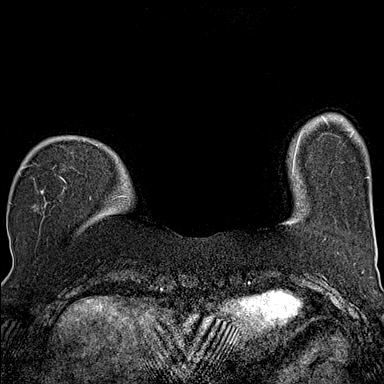
[im 72/144]
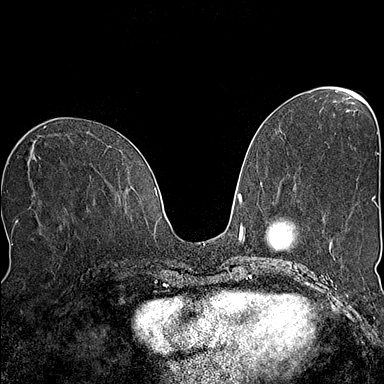
[im 108/144]
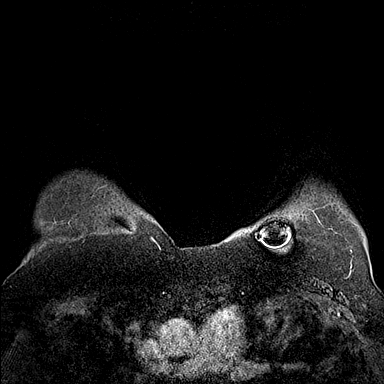
[im 144/144]
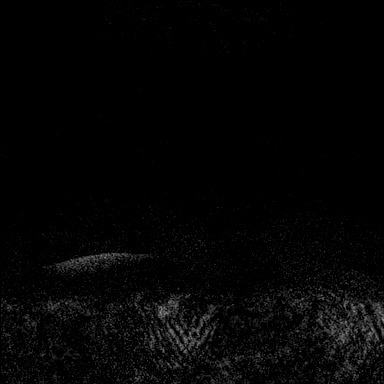

[Series 6: fl3d post-cm 20 · axial · 1.2mm · 0.91mm/px · z∈[-102,+70]mm · 5 of 144 slices shown (2 of 3)]
[im 1/144]
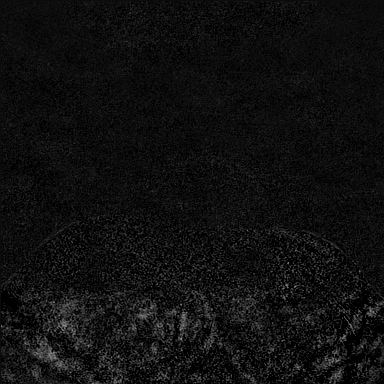
[im 36/144]
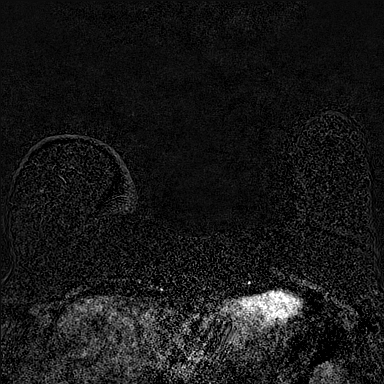
[im 72/144]
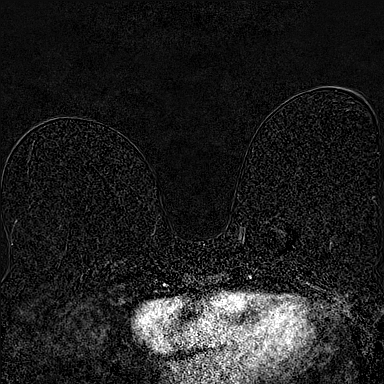
[im 108/144]
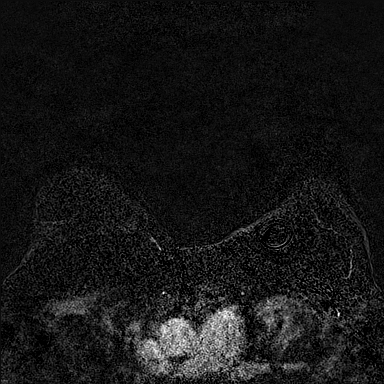
[im 144/144]
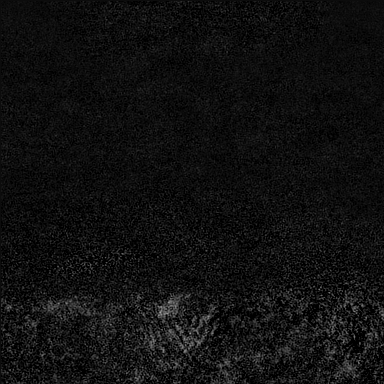

[Series 7: fl3d post-cm 20 · axial · 172.8mm · 0.91mm/px · 1 of 1 slices shown (3 of 3)]
[im 1/1]
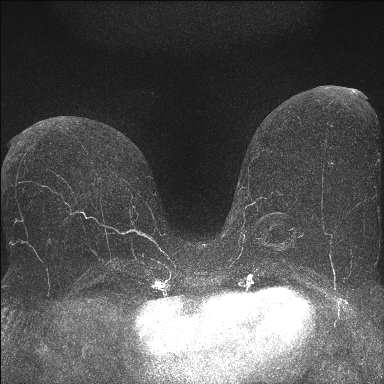

[Series 8: fl3d post-cm 3 · axial · 1.2mm · 0.91mm/px · z∈[-102,+70]mm · 6 of 144 slices shown (1 of 2)]
[im 1/144]
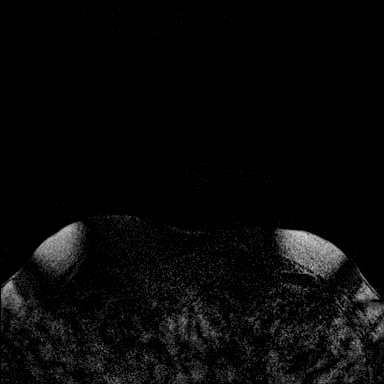
[im 29/144]
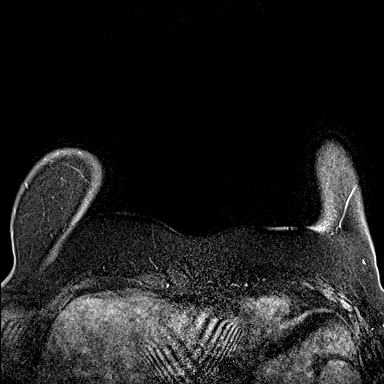
[im 58/144]
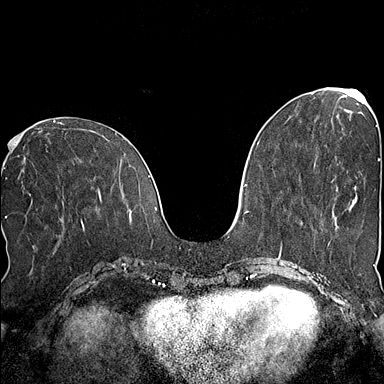
[im 86/144]
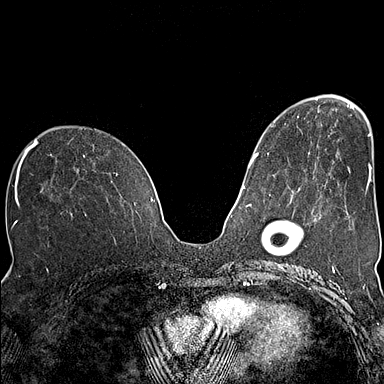
[im 115/144]
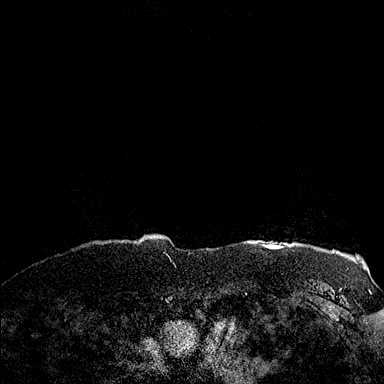
[im 144/144]
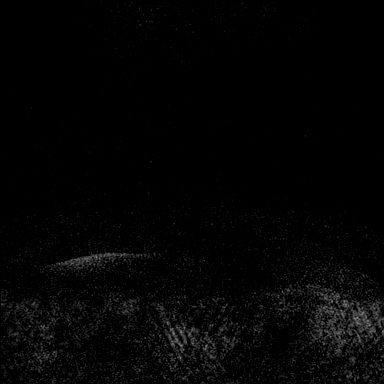

[Series 9: fl3d post-cm 3 · axial · 1.2mm · 0.91mm/px · z∈[-102,+35]mm · 5 of 144 slices shown (2 of 2)]
[im 1/144]
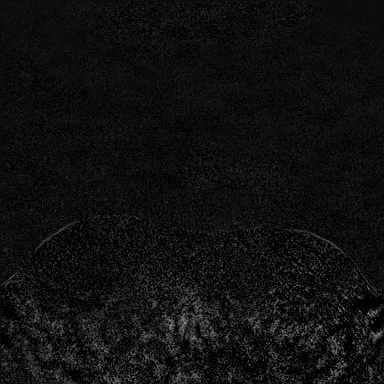
[im 29/144]
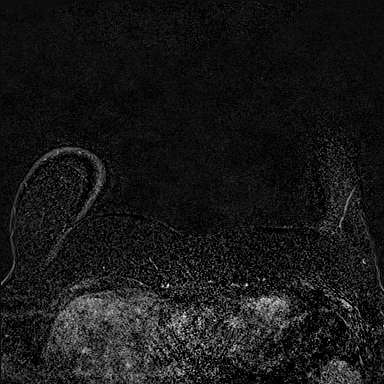
[im 58/144]
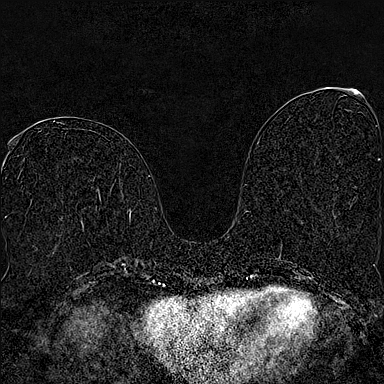
[im 86/144]
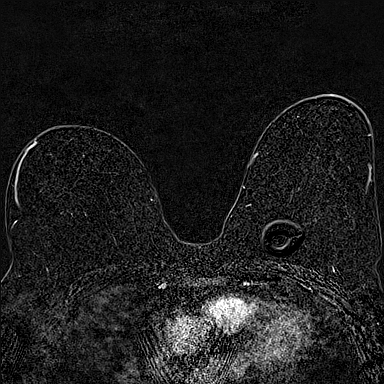
[im 115/144]
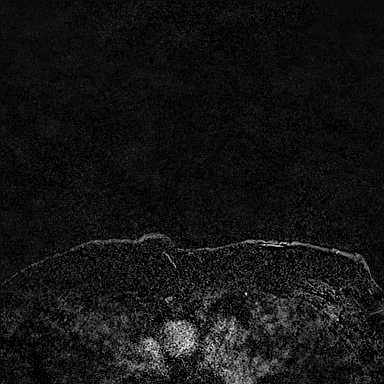

[33 of 48 positions shown; findings below may reference images not displayed]

Three-dimensional MR images were rendered by post-processing of the
original MR data on an independent workstation. The
three-dimensional MR images were interpreted, and findings are
reported in the following complete MRI report for this study. Three
dimensional images were evaluated at the independent interpreting
workstation using the DynaCAD thin client.
FINDINGS: Breast composition: b. Scattered fibroglandular tissue.

Background parenchymal enhancement: Minimal

Right breast: No mass or abnormal enhancement.

Left breast: In the central inferior left breast there is an
irregular enhancing mass measuring approximately 0.8 cm (series 11,
image 92). There are no additional suspicious findings elsewhere in
the left breast.

Lymph nodes: No abnormal appearing lymph nodes.

Ancillary findings:  None.
IMPRESSION: 1. Indeterminate irregular enhancing mass measuring 0.8 cm in the
central inferior left breast.

2.  No MRI evidence of malignancy in the right breast.

RECOMMENDATION:
Recommend second-look ultrasound and possible biopsy of the
indeterminate mass in the central inferior left breast. If the mass
cannot be identified sonographically recommend MRI guided biopsy.

BI-RADS CATEGORY  4: Suspicious.

## 2021-10-18 MED ORDER — GADOBUTROL 1 MMOL/ML IV SOLN
10.0000 mL | Freq: Once | INTRAVENOUS | Status: AC | PRN
  Administered 2021-10-18: 10 mL via INTRAVENOUS

## 2021-10-19 ENCOUNTER — Other Ambulatory Visit: Payer: Self-pay | Admitting: Obstetrics and Gynecology

## 2021-10-19 DIAGNOSIS — R9389 Abnormal findings on diagnostic imaging of other specified body structures: Secondary | ICD-10-CM

## 2021-10-27 ENCOUNTER — Ambulatory Visit
Admission: RE | Admit: 2021-10-27 | Discharge: 2021-10-27 | Disposition: A | Discharge status: Home / Self Care | Payer: BC Managed Care – PPO | Source: Ambulatory Visit | Attending: Obstetrics and Gynecology | Admitting: Obstetrics and Gynecology

## 2021-10-27 DIAGNOSIS — R9389 Abnormal findings on diagnostic imaging of other specified body structures: Secondary | ICD-10-CM

## 2021-10-27 HISTORY — PX: BREAST BIOPSY: SHX20

## 2021-10-27 IMAGING — US US BREAST BX W LOC DEV 1ST LESION IMG BX SPEC US GUIDE*L*
1 series · 12 of 12 positions shown · non-contrast
Comparison: Previous exam(s).
COMPARISON: Previous exam(s).

Addendum:
CLINICAL DATA: 55-year-old female presenting for ultrasound-guided
biopsy of a left breast mass.

EXAM:
ULTRASOUND GUIDED LEFT BREAST CORE NEEDLE BIOPSY

[Series 1: us breast bx w loc dev 1st lesion img bx spec us g · 0.07mm/px · 12 of 12 slices shown]
[im 1/12]
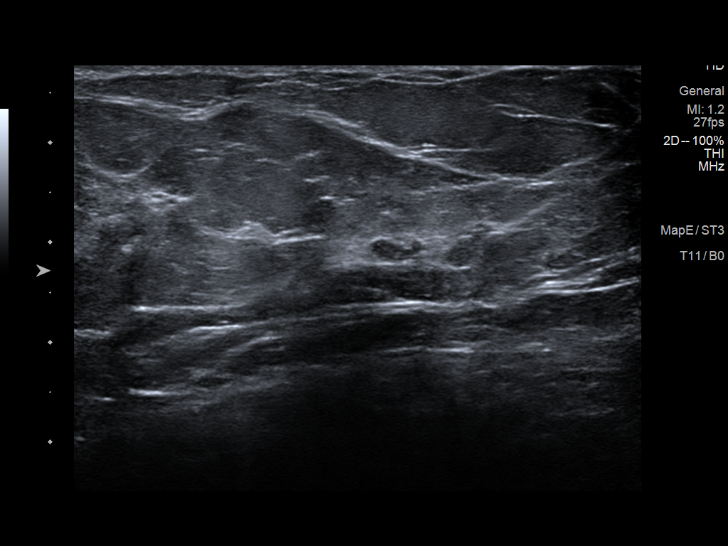
[im 2/12]
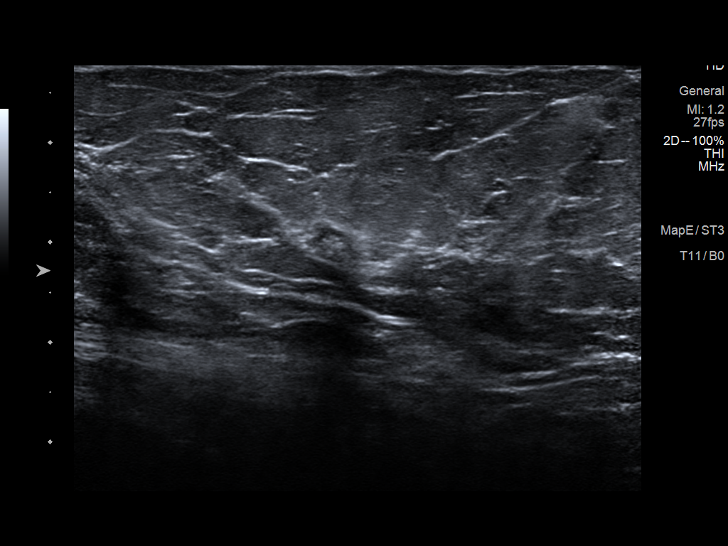
[im 3/12]
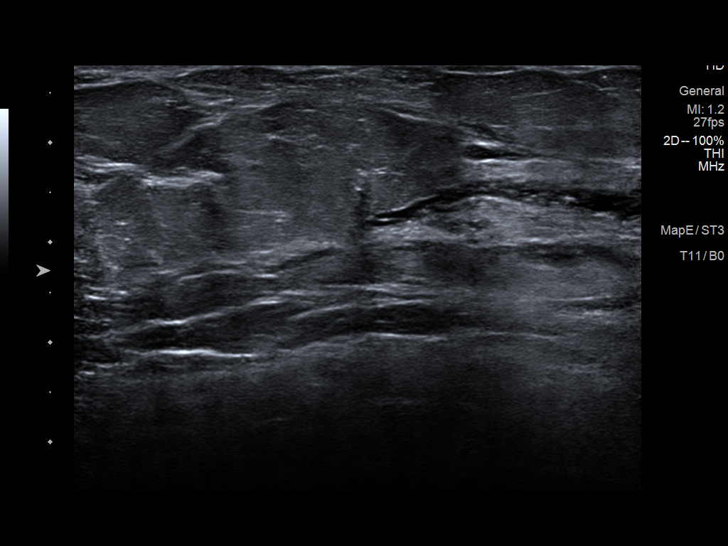
[im 4/12]
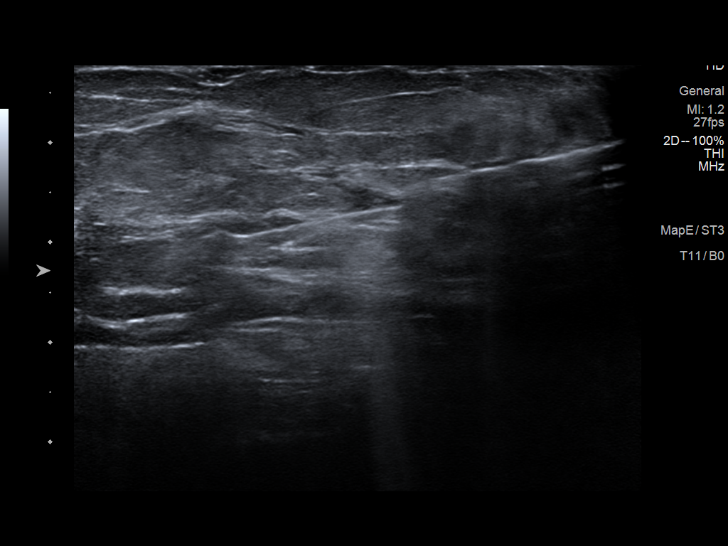
[im 5/12]
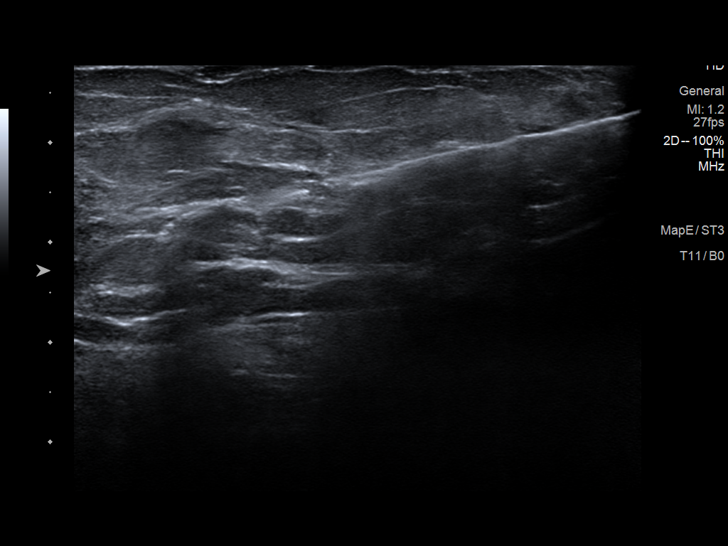
[im 6/12]
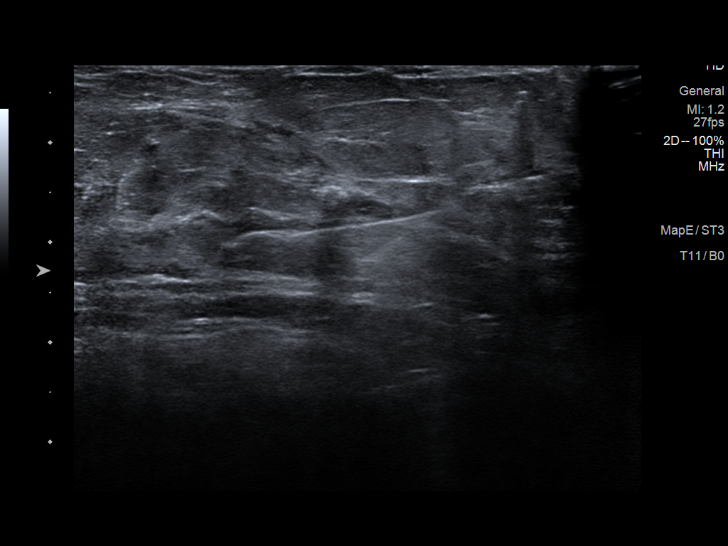
[im 7/12]
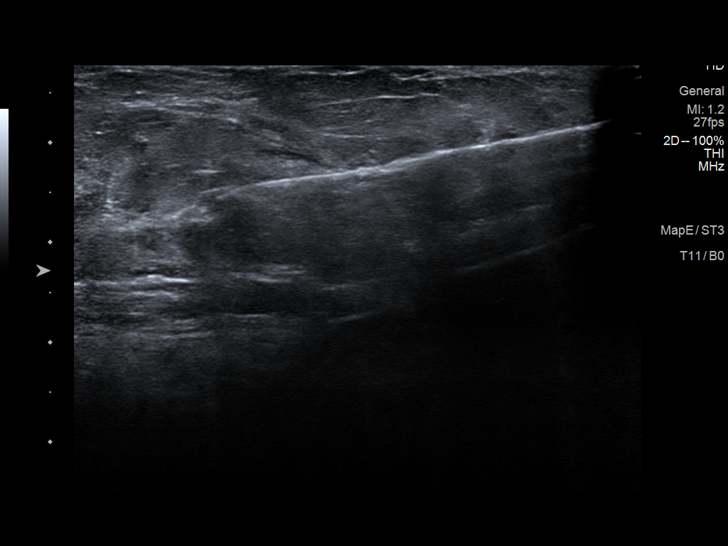
[im 8/12]
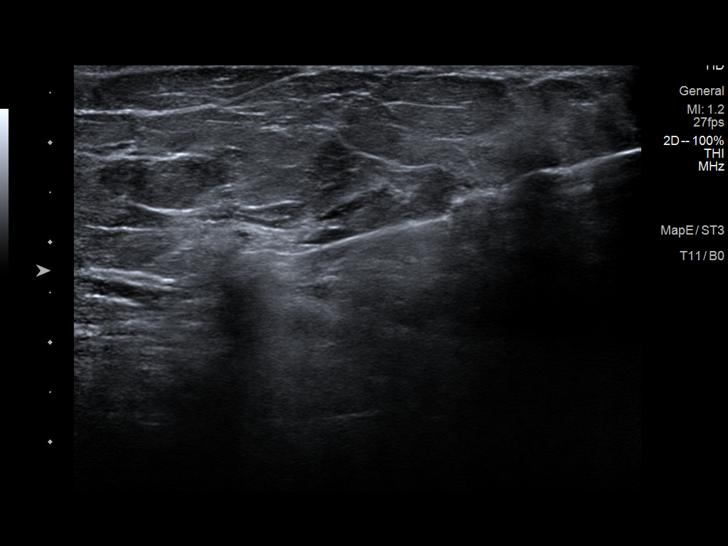
[im 9/12]
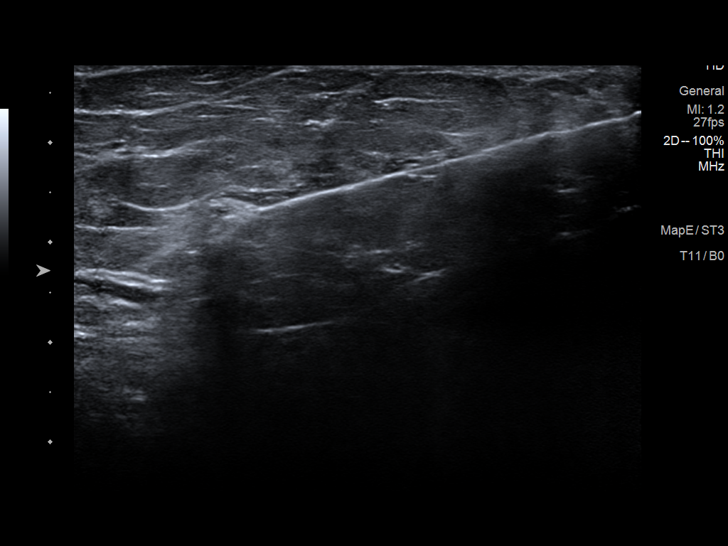
[im 10/12]
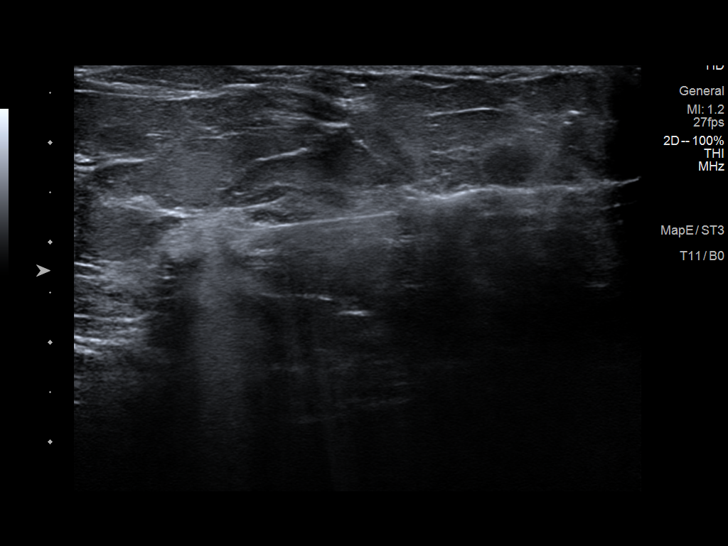
[im 11/12]
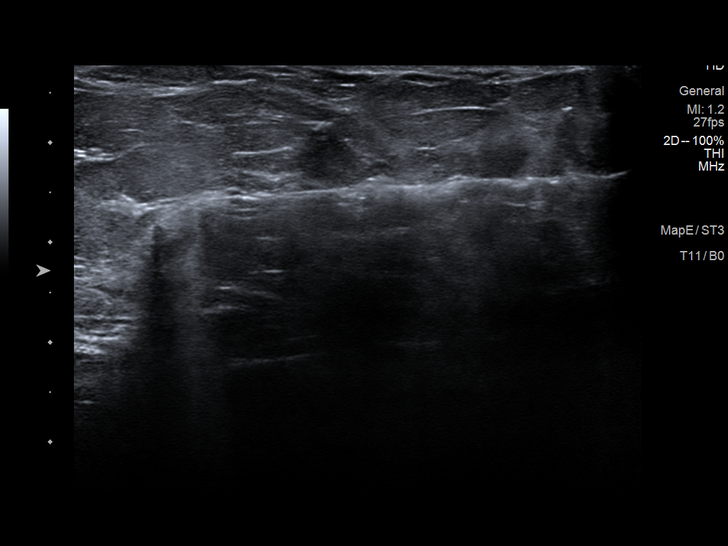
[im 12/12]
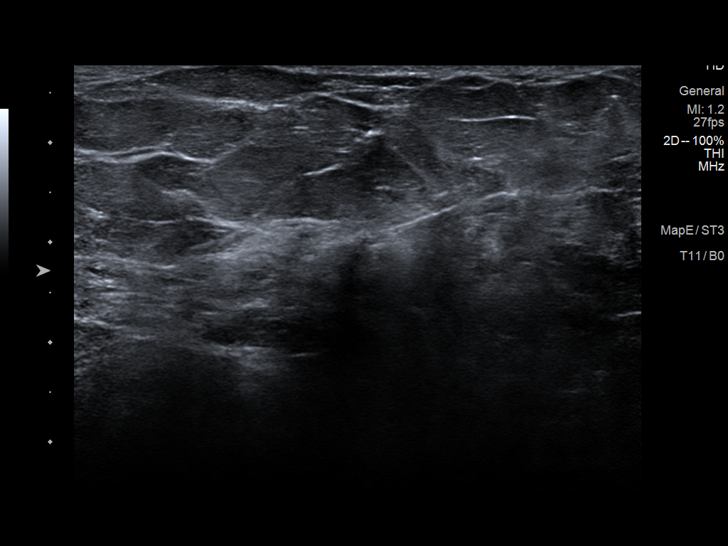

[12 of 12 positions shown; findings below may reference images not displayed]



Lesion quadrant: Lower outer quadrant

Using sterile technique and 1% Lidocaine as local anesthetic, under
direct ultrasound visualization, a 14 gauge BULMINI device was
used to perform biopsy of a mass in the left breast at 5 o'clock, 2
cm from the nipple using a lateral approach. At the conclusion of
the procedure a coil shaped tissue marker clip was deployed into the
biopsy cavity. Follow up 2 view mammogram was performed and dictated
separately.
IMPRESSION: Ultrasound guided biopsy of a left breast mass at 5 o'clock. No
apparent complications.

ADDENDUM:
Pathology revealed FIBROCYSTIC CHANGES WITH CALCIFICATIONS- NEGATIVE
FOR MALIGNANCY of the LEFT breast, 5 o'clock, [B7]. This was found
to be concordant by Dr. BULMINI.

Pathology results were discussed with the patient by telephone. The
patient reported doing well after the biopsy with tenderness at the
site. Post biopsy instructions and care were reviewed and questions
were answered. The patient was encouraged to call The [REDACTED]

Recommendations:

The patient was instructed to return for annual screening
mammography and informed a reminder notice would be sent regarding
this appointment.

A non-contrast T1 series MRI was performed on [DATE] confirming
that the clip placed during the ultrasound guided biopsy is located
at the site of the indeterminate mass on her original MRI from
[DATE].

Pathology results reported by BULMINI RN on [DATE].



Lesion quadrant: Lower outer quadrant

Using sterile technique and 1% Lidocaine as local anesthetic, under
direct ultrasound visualization, a 14 gauge BULMINI device was
used to perform biopsy of a mass in the left breast at 5 o'clock, 2
cm from the nipple using a lateral approach. At the conclusion of
the procedure a coil shaped tissue marker clip was deployed into the
biopsy cavity. Follow up 2 view mammogram was performed and dictated
separately.
IMPRESSION: Ultrasound guided biopsy of a left breast mass at 5 o'clock. No
apparent complications.

## 2021-10-27 IMAGING — US US BREAST*L* LIMITED INC AXILLA
1 series · 8 of 8 positions shown · non-contrast
Comparison: Previous exam(s).

CLINICAL DATA: 55-year-old female recalled from high risk screening
MRI to evaluate an 8 mm enhancing mass in the left breast.

EXAM:
ULTRASOUND OF THE LEFT BREAST

[Series 1: us breast*left* limited inc axilla · 0.06mm/px · 8 of 8 slices shown]
[im 1/8]
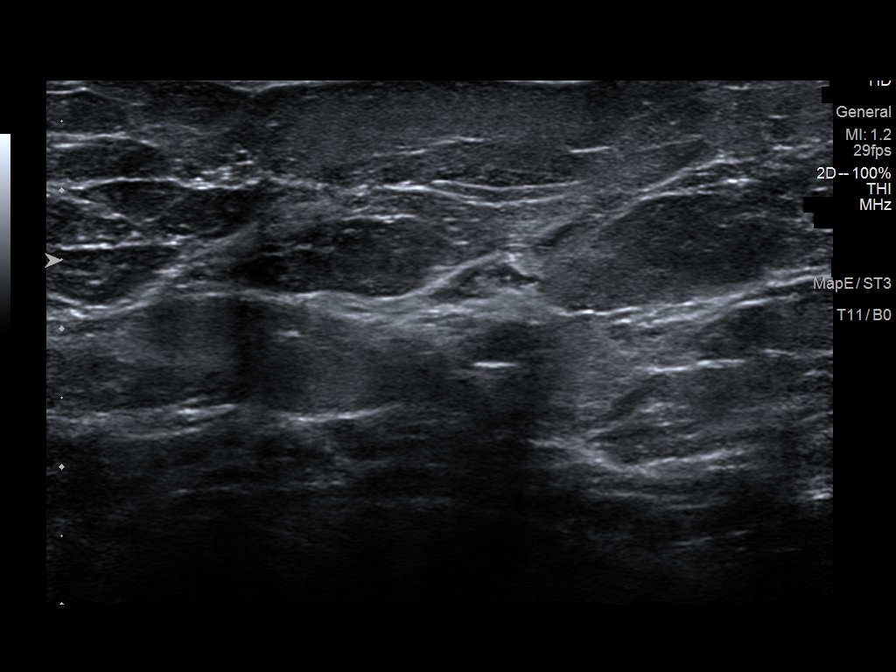
[im 2/8]
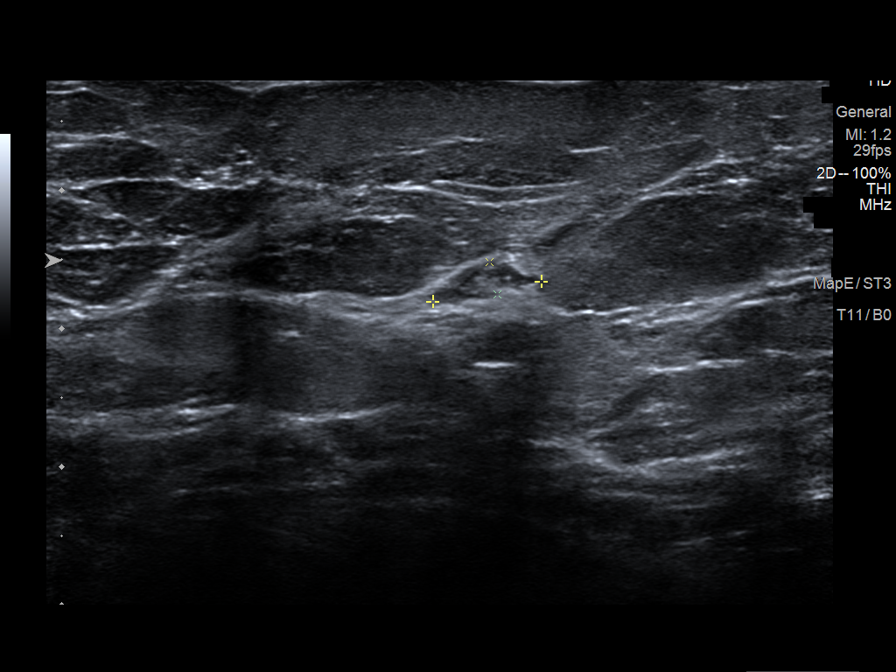
[im 3/8]
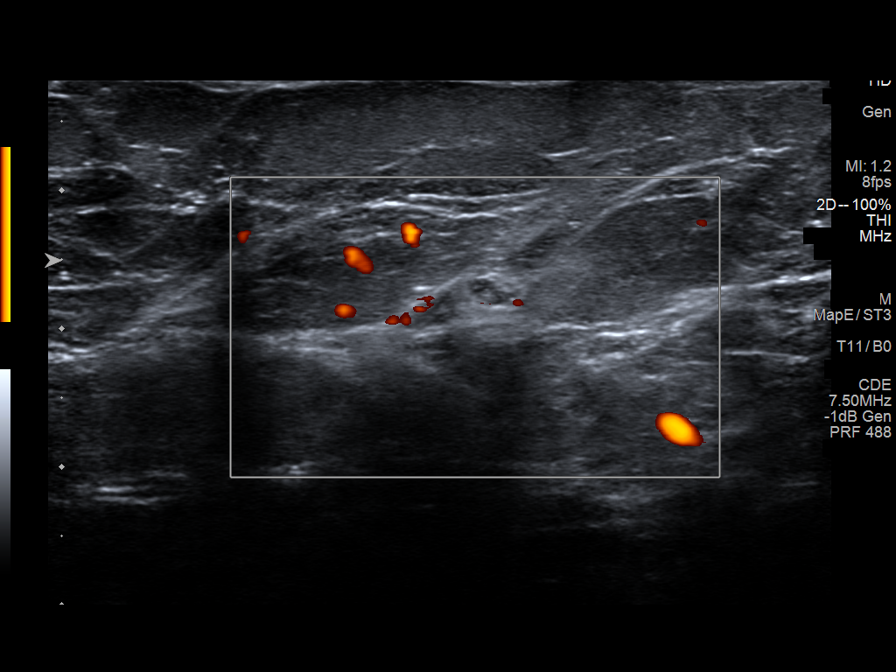
[im 4/8]
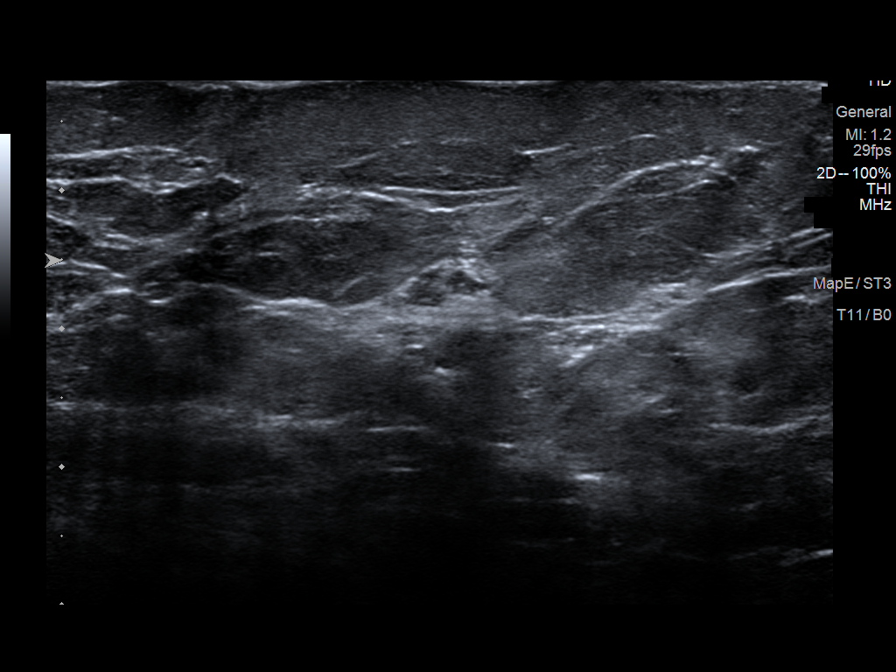
[im 5/8]
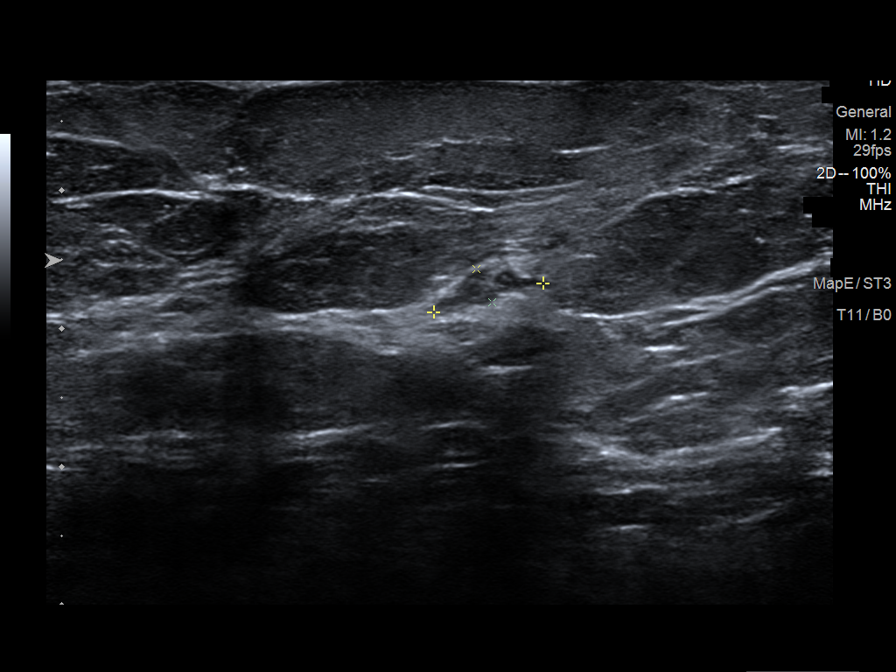
[im 6/8]
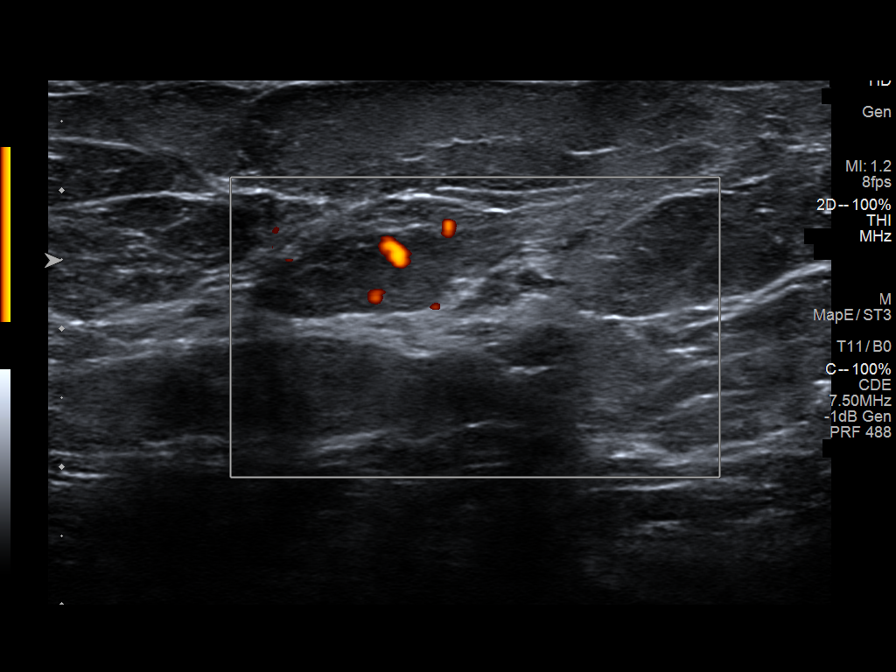
[im 7/8]
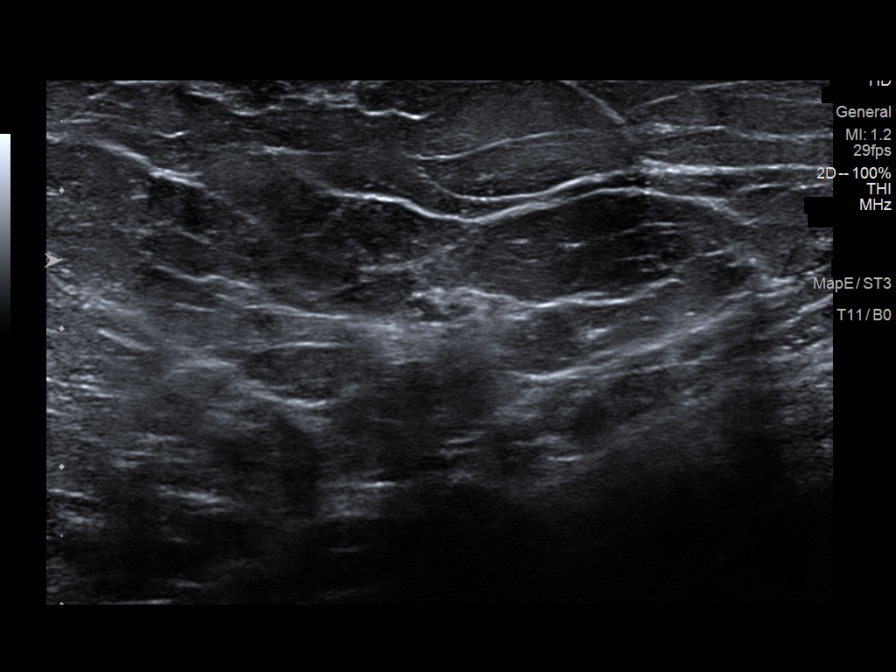
[im 8/8]
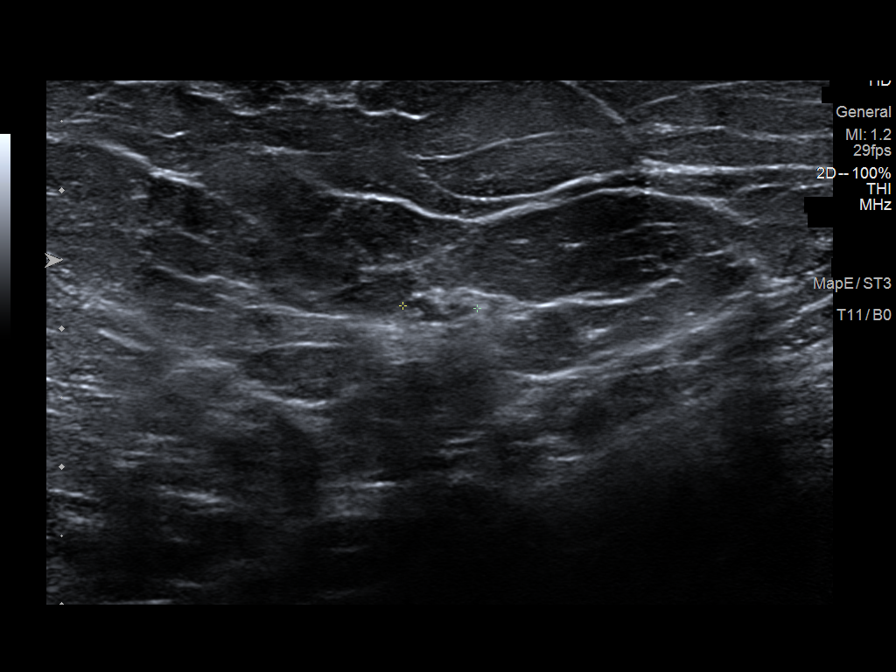

[8 of 8 positions shown; findings below may reference images not displayed]

FINDINGS: Ultrasound of the left breast at 5 o'clock, 2 cm from the nipple
demonstrates an irregular hypoechoic mass measuring 8 x 3 x 5 mm.
IMPRESSION: There is an 8 mm mass in the left breast at 5 o'clock which likely
corresponds with the MRI finding.

RECOMMENDATION:
Ultrasound guided biopsy is recommended for the left breast mass.
This will be performed today and dictated in a separate report.

I have discussed the findings and recommendations with the patient.
If applicable, a reminder letter will be sent to the patient
regarding the next appointment.

BI-RADS CATEGORY  4: Suspicious.

## 2021-10-27 IMAGING — MG MM BREAST LOCALIZATION CLIP
4 series · 4 of 12 positions shown · non-contrast
Comparison: Previous exam(s).

CLINICAL DATA: Post biopsy mammogram of the left breast for clip
placement.

EXAM:
3D DIAGNOSTIC LEFT MAMMOGRAM POST ULTRASOUND BIOPSY

[L CC synth-2D]
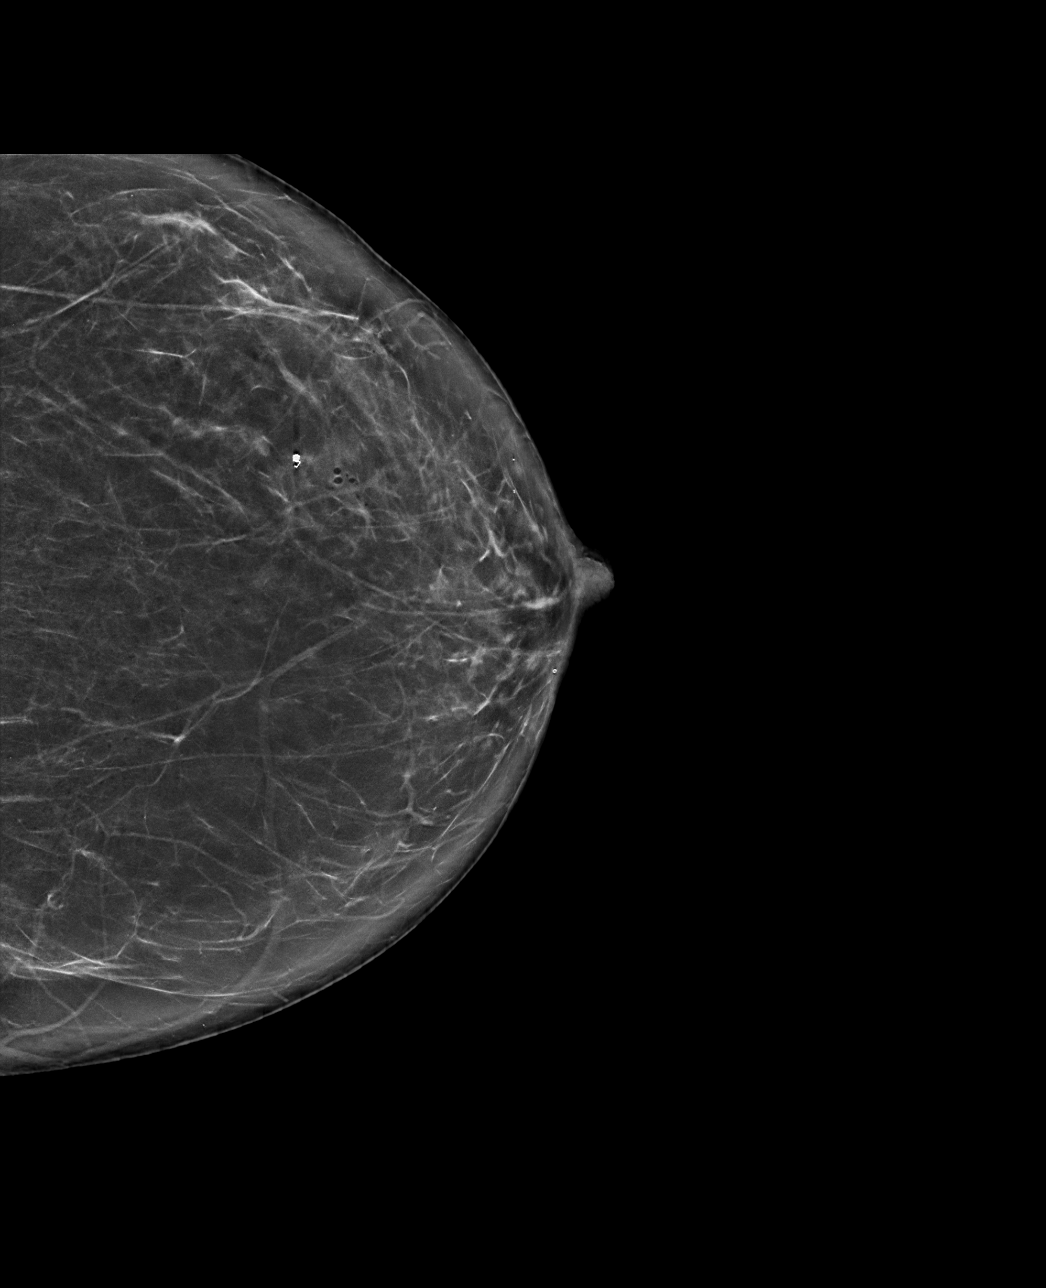

[L ML synth-2D]
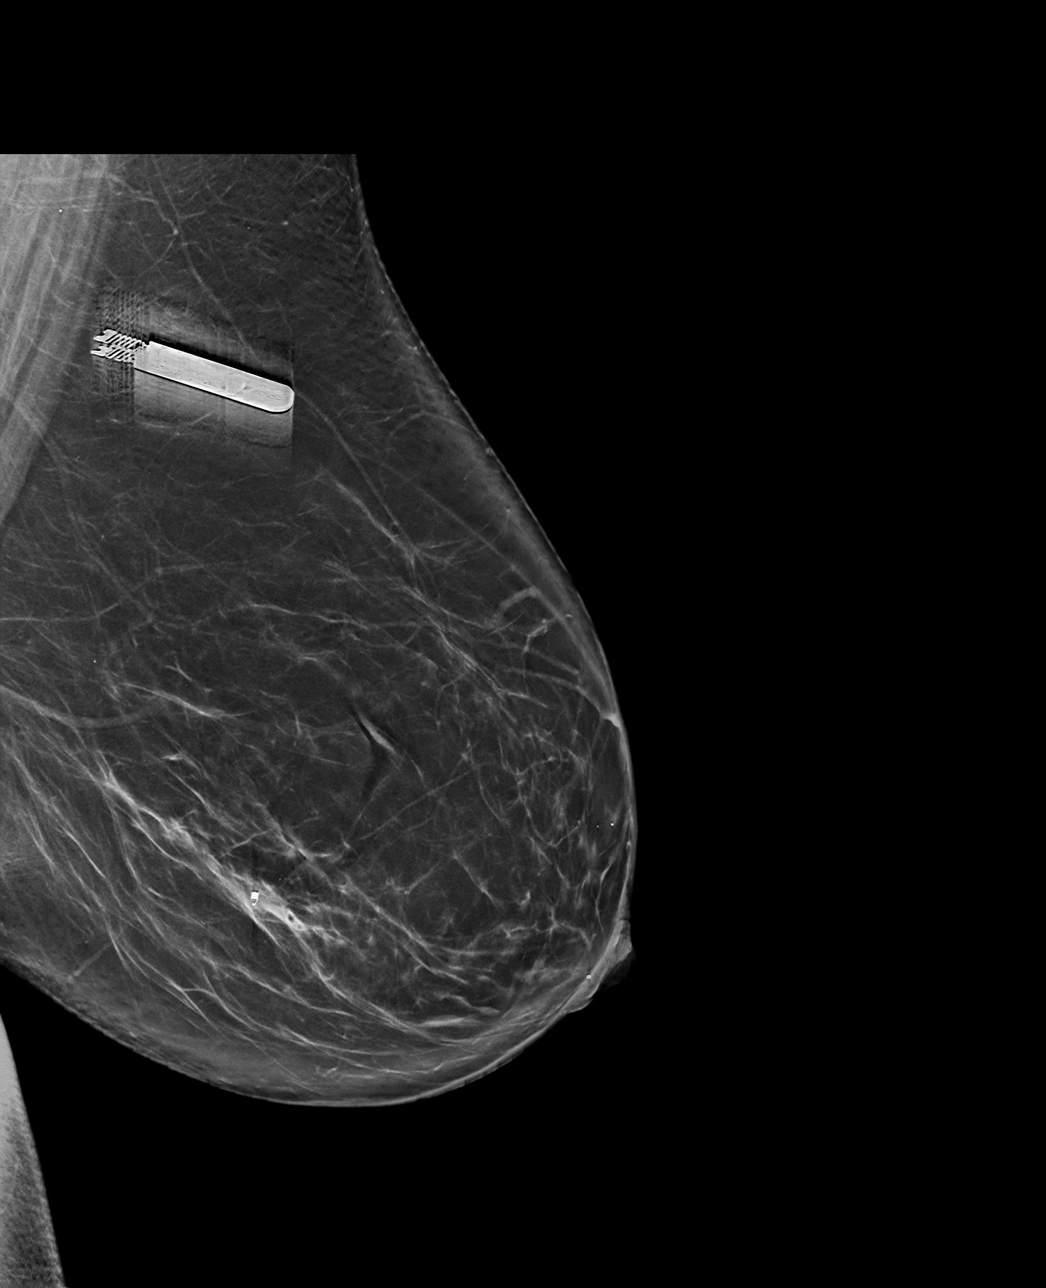

[L CC tomo · tomo slice 39/76.0]
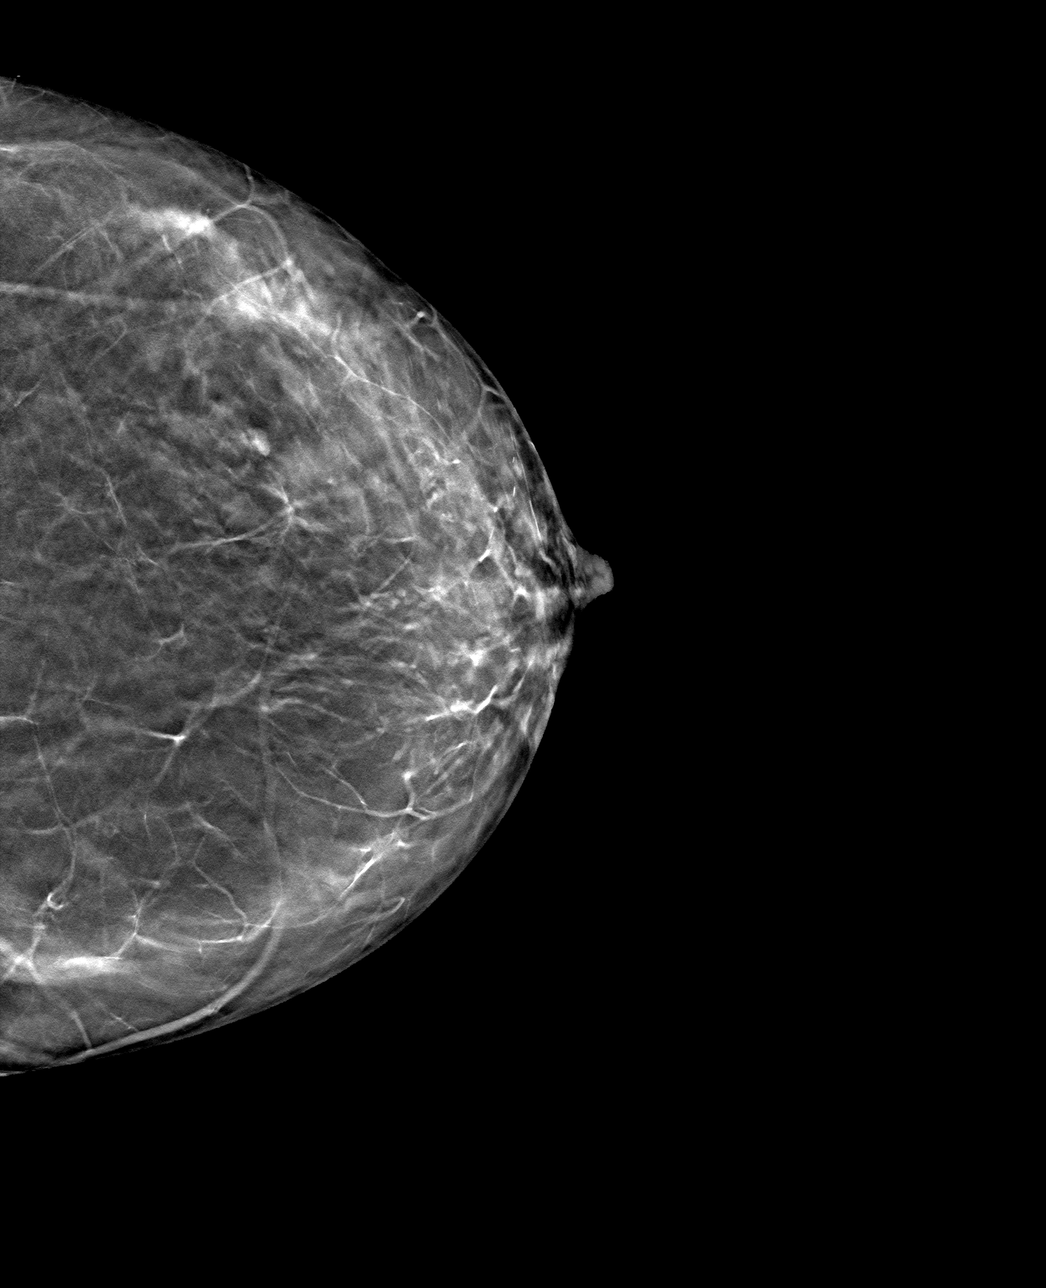

[L ML tomo · tomo slice 46/91.0]
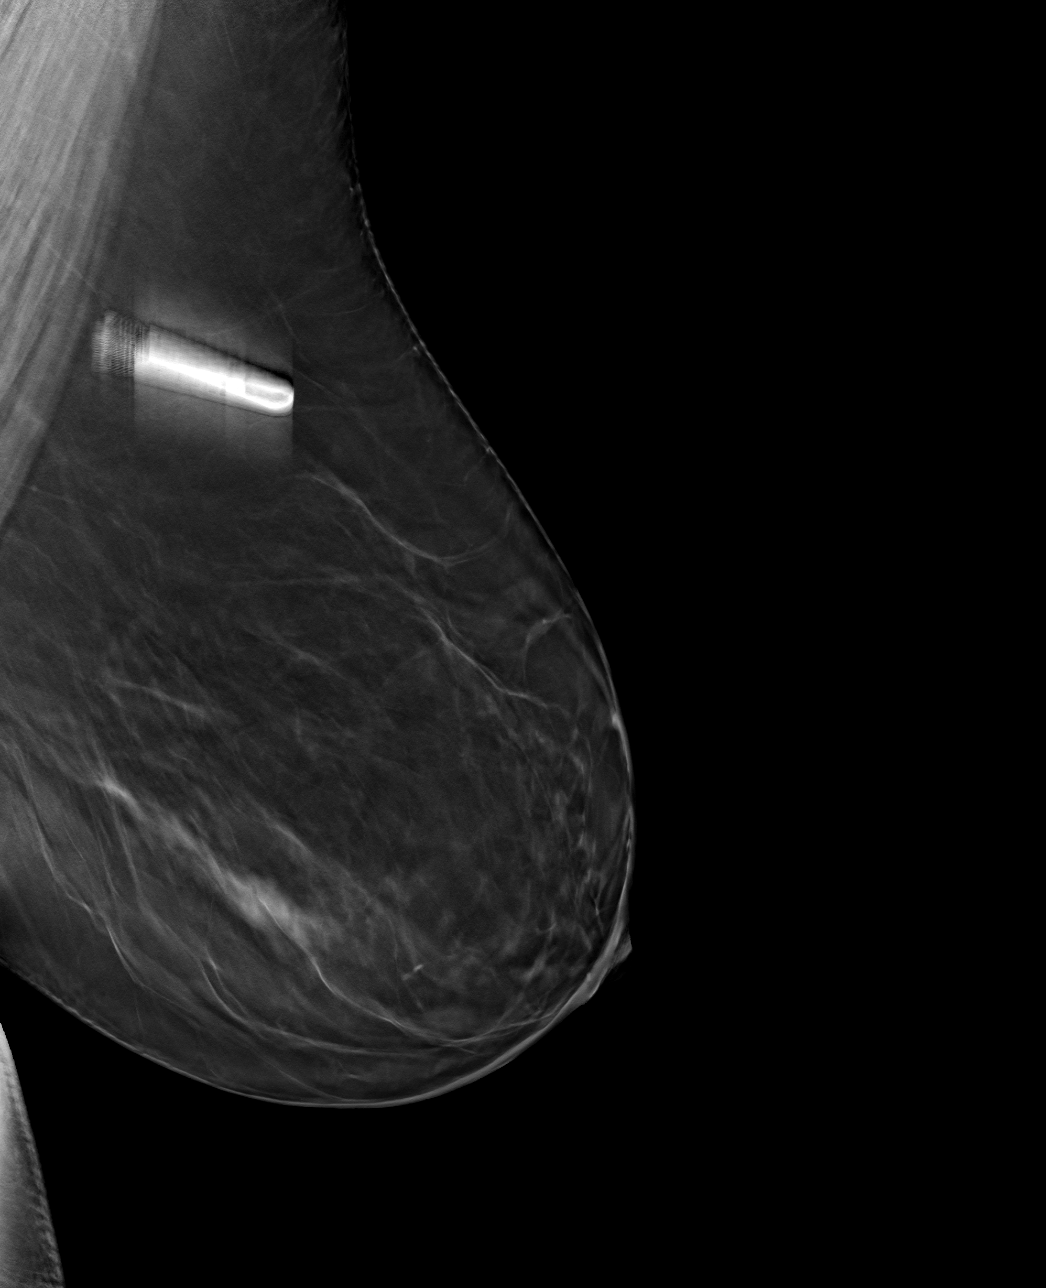

[4 of 12 positions shown; findings below may reference images not displayed]

FINDINGS: 3D Mammographic images were obtained following ultrasound guided
biopsy of a mass in the left breast at 5 o'clock. The biopsy marking
clip is in expected position at the site of biopsy.
IMPRESSION: Appropriate positioning of the coil shaped biopsy marking clip at
the site of biopsy in the lower outer left breast.

Final Assessment: Post Procedure Mammograms for Marker Placement

## 2021-11-02 IMAGING — MR MR BREAST BILAT WO/W CM
1 series · 34 of 48 positions shown · IV contrast (gadavist)
Comparison: None.

CLINICAL DATA: High risk screening exam. Family history of breast
cancer in the patient's mother at age 40.

EXAM:
BILATERAL BREAST MRI WITH AND WITHOUT CONTRAST
TECHNIQUE: Multiplanar, multisequence MR images of both breasts were obtained
prior to and following the intravenous administration of 10 ml of
Gadavist

[Series 2: fl3d pre-cm no · axial · non-contrast · 1.2mm · 0.94mm/px · z∈[-42,+85]mm · 34 of 112 slices shown]
[im 1/112]
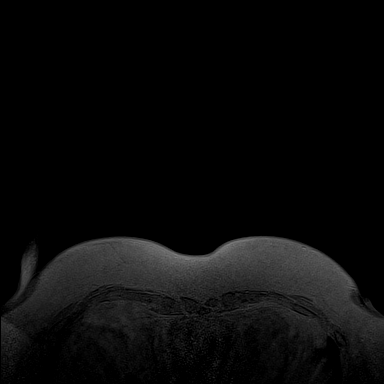
[im 3/112]
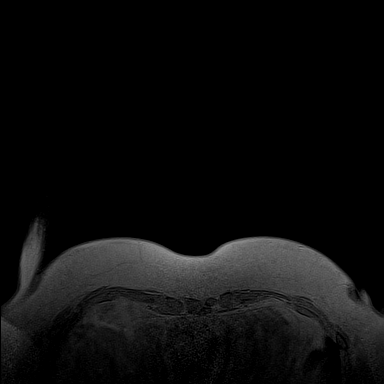
[im 5/112]
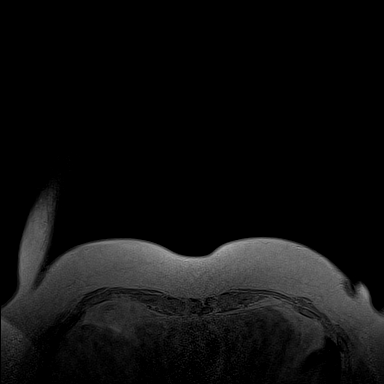
[im 8/112]
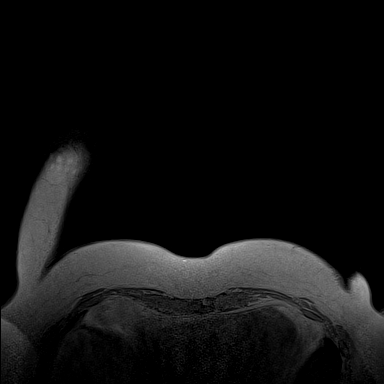
[im 10/112]
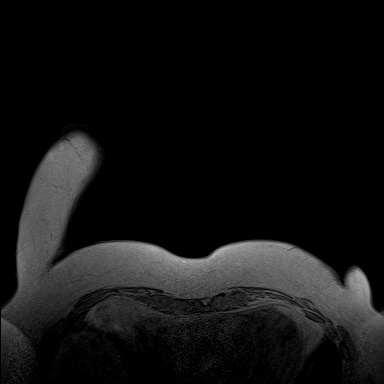
[im 12/112]
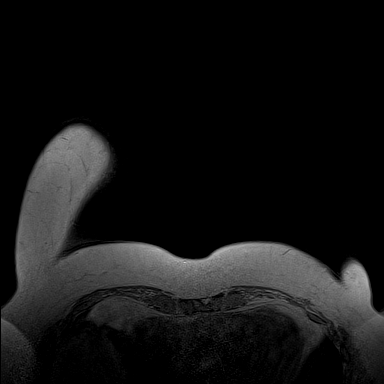
[im 15/112]
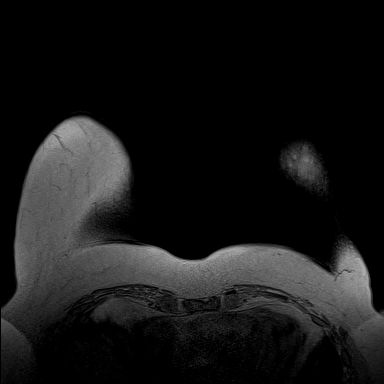
[im 17/112]
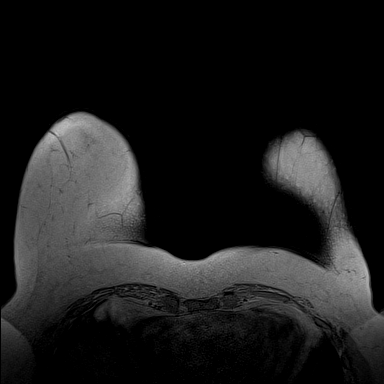
[im 19/112]
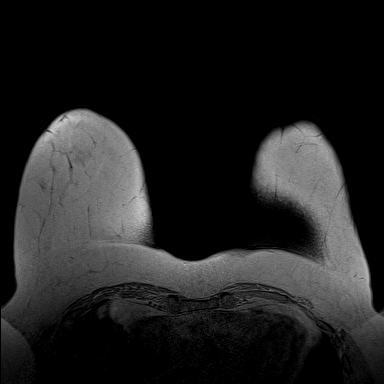
[im 22/112]
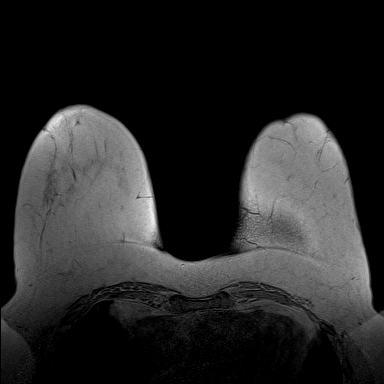
[im 24/112]
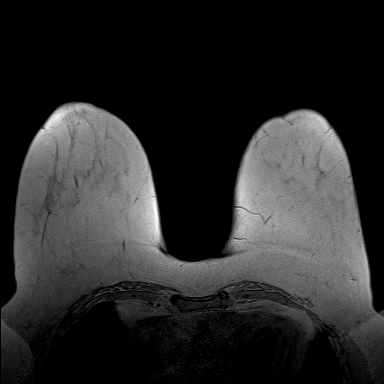
[im 26/112]
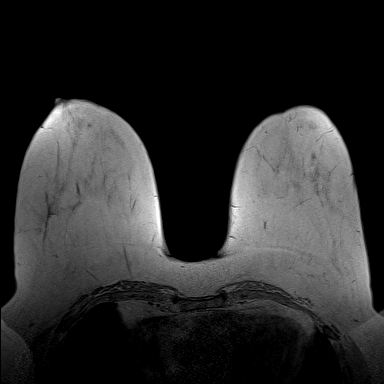
[im 29/112]
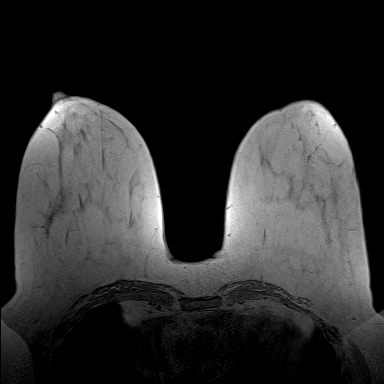
[im 31/112]
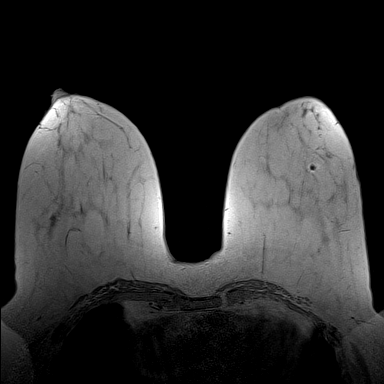
[im 34/112]
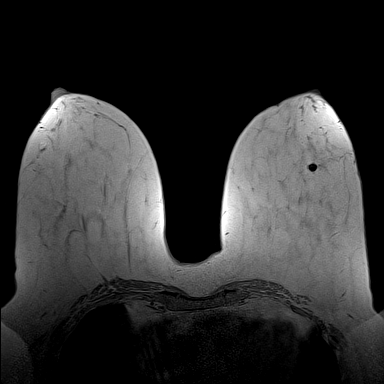
[im 36/112]
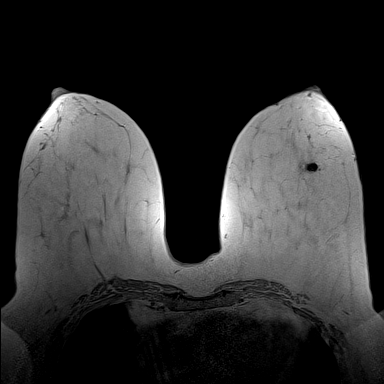
[im 38/112]
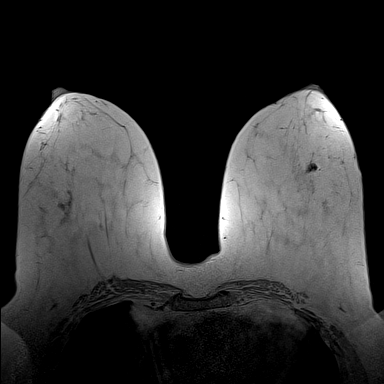
[im 41/112]
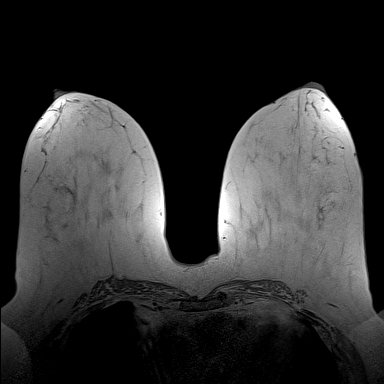
[im 43/112]
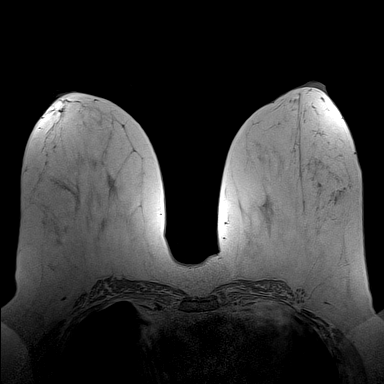
[im 45/112]
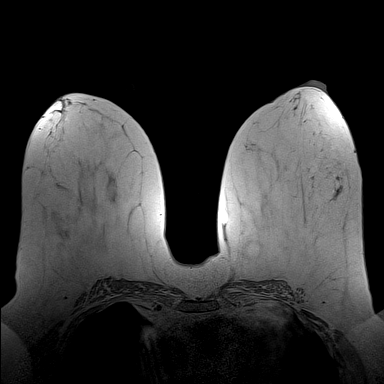
[im 48/112]
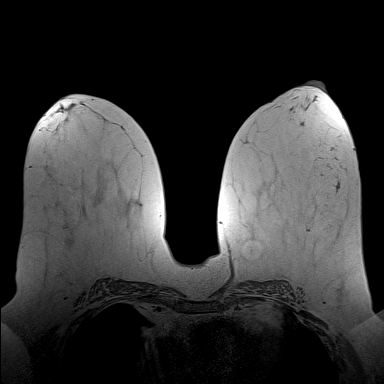
[im 50/112]
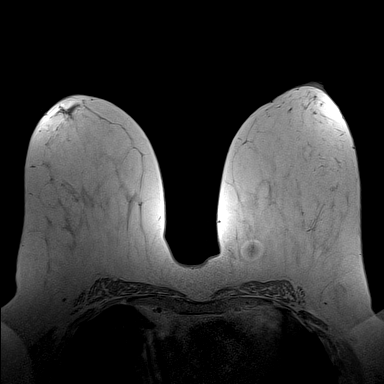
[im 52/112]
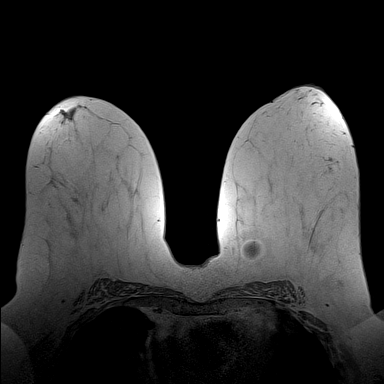
[im 55/112]
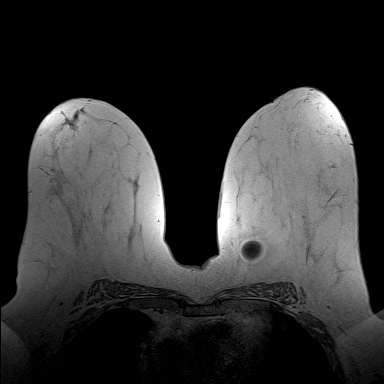
[im 57/112]
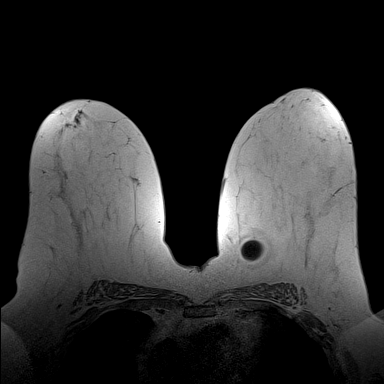
[im 60/112]
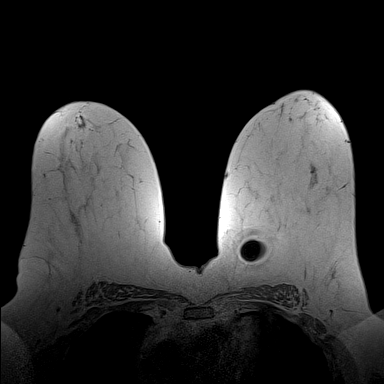
[im 62/112]
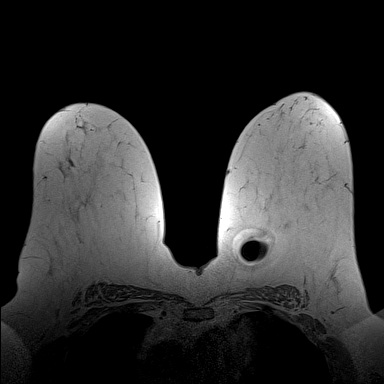
[im 64/112]
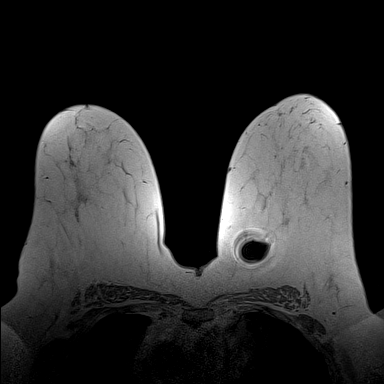
[im 67/112]
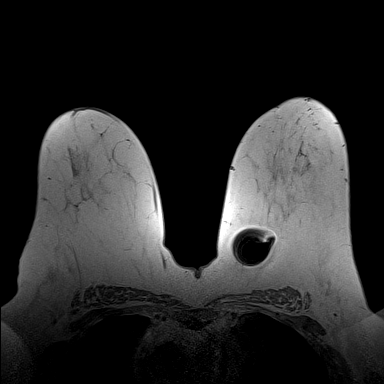
[im 69/112]
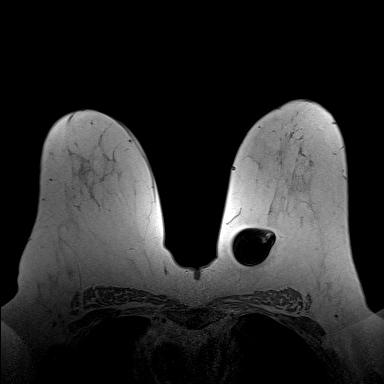
[im 78/112]
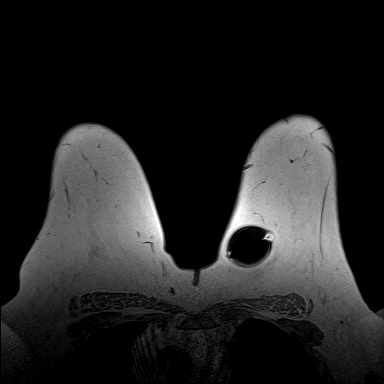
[im 93/112]
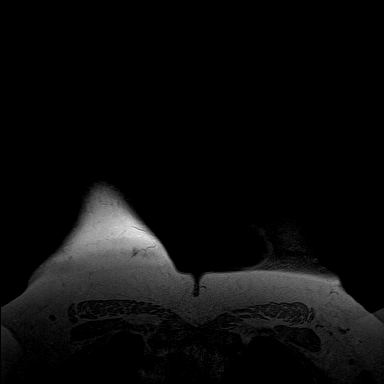
[im 95/112]
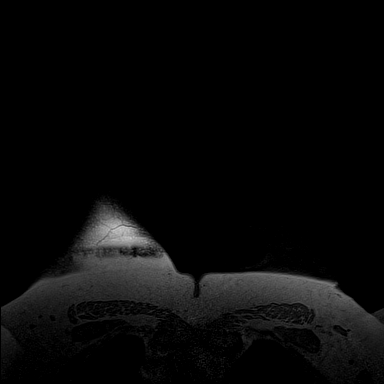
[im 107/112]
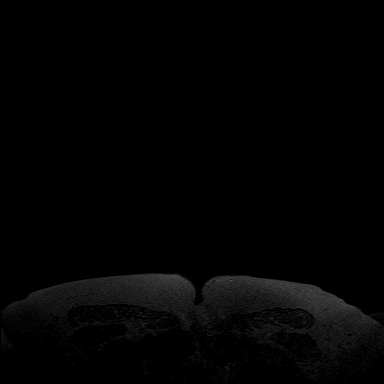

[34 of 48 positions shown; findings below may reference images not displayed]

Three-dimensional MR images were rendered by post-processing of the
original MR data on an independent workstation. The
three-dimensional MR images were interpreted, and findings are
reported in the following complete MRI report for this study. Three
dimensional images were evaluated at the independent interpreting
workstation using the [employer] thin client.
FINDINGS: Breast composition: b. Scattered fibroglandular tissue.

Background parenchymal enhancement: Minimal

Right breast: No mass or abnormal enhancement.

Left breast: In the central inferior left breast there is an
irregular enhancing mass measuring approximately 0.8 cm (series 11,
image 92). There are no additional suspicious findings elsewhere in
the left breast.

Lymph nodes: No abnormal appearing lymph nodes.

Ancillary findings:  None.
IMPRESSION: 1. Indeterminate irregular enhancing mass measuring 0.8 cm in the
central inferior left breast.

2.  No MRI evidence of malignancy in the right breast.

RECOMMENDATION:
Recommend second-look ultrasound and possible biopsy of the
indeterminate mass in the central inferior left breast. If the mass
cannot be identified sonographically recommend MRI guided biopsy.

BI-RADS CATEGORY  4: Suspicious.
